# Patient Record
Sex: Female | Born: 1975 | Race: White | Hispanic: Yes | Marital: Single | Smoking: Never smoker
Health system: Southern US, Community
[De-identification: ages and names within clinical notes are randomized; demographics above are authoritative.]

## PROBLEM LIST (undated history)

## (undated) DIAGNOSIS — O139 Gestational [pregnancy-induced] hypertension without significant proteinuria, unspecified trimester: Secondary | ICD-10-CM

## (undated) DIAGNOSIS — G43909 Migraine, unspecified, not intractable, without status migrainosus: Secondary | ICD-10-CM

## (undated) HISTORY — DX: Gestational (pregnancy-induced) hypertension without significant proteinuria, unspecified trimester: O13.9

## (undated) HISTORY — DX: Migraine, unspecified, not intractable, without status migrainosus: G43.909

---

## 2003-06-28 ENCOUNTER — Inpatient Hospital Stay (HOSPITAL_COMMUNITY): Admission: AD | Admit: 2003-06-28 | Discharge: 2003-06-28 | Payer: Self-pay | Admitting: Obstetrics and Gynecology

## 2003-06-30 ENCOUNTER — Inpatient Hospital Stay (HOSPITAL_COMMUNITY): Admission: AD | Admit: 2003-06-30 | Discharge: 2003-06-30 | Payer: Self-pay | Admitting: Obstetrics & Gynecology

## 2004-01-03 ENCOUNTER — Ambulatory Visit (HOSPITAL_COMMUNITY): Admission: RE | Admit: 2004-01-03 | Discharge: 2004-01-03 | Payer: Self-pay | Admitting: Obstetrics

## 2004-07-29 ENCOUNTER — Inpatient Hospital Stay (HOSPITAL_COMMUNITY): Admission: AD | Admit: 2004-07-29 | Discharge: 2004-08-01 | Payer: Self-pay | Admitting: Obstetrics

## 2005-12-31 ENCOUNTER — Emergency Department (HOSPITAL_COMMUNITY): Admission: EM | Admit: 2005-12-31 | Discharge: 2005-12-31 | Payer: Self-pay | Admitting: Emergency Medicine

## 2006-07-06 ENCOUNTER — Emergency Department (HOSPITAL_COMMUNITY): Admission: EM | Admit: 2006-07-06 | Discharge: 2006-07-06 | Payer: Self-pay | Admitting: Emergency Medicine

## 2008-02-14 ENCOUNTER — Inpatient Hospital Stay (HOSPITAL_COMMUNITY): Admission: AD | Admit: 2008-02-14 | Discharge: 2008-02-14 | Payer: Self-pay | Admitting: Family Medicine

## 2008-06-09 ENCOUNTER — Ambulatory Visit (HOSPITAL_COMMUNITY): Admission: RE | Admit: 2008-06-09 | Discharge: 2008-06-09 | Payer: Self-pay | Admitting: Obstetrics & Gynecology

## 2008-08-05 ENCOUNTER — Ambulatory Visit: Payer: Self-pay | Admitting: Obstetrics and Gynecology

## 2008-08-05 ENCOUNTER — Inpatient Hospital Stay (HOSPITAL_COMMUNITY): Admission: AD | Admit: 2008-08-05 | Discharge: 2008-08-05 | Payer: Self-pay | Admitting: Obstetrics and Gynecology

## 2008-08-11 ENCOUNTER — Inpatient Hospital Stay (HOSPITAL_COMMUNITY): Admission: AD | Admit: 2008-08-11 | Discharge: 2008-08-11 | Payer: Self-pay | Admitting: Obstetrics & Gynecology

## 2008-08-11 ENCOUNTER — Ambulatory Visit: Payer: Self-pay | Admitting: Obstetrics & Gynecology

## 2008-08-14 ENCOUNTER — Ambulatory Visit: Payer: Self-pay | Admitting: Obstetrics and Gynecology

## 2008-08-14 ENCOUNTER — Inpatient Hospital Stay (HOSPITAL_COMMUNITY): Admission: AD | Admit: 2008-08-14 | Discharge: 2008-08-14 | Payer: Self-pay | Admitting: Obstetrics & Gynecology

## 2008-08-17 ENCOUNTER — Ambulatory Visit: Payer: Self-pay | Admitting: Obstetrics & Gynecology

## 2008-08-21 ENCOUNTER — Inpatient Hospital Stay (HOSPITAL_COMMUNITY): Admission: AD | Admit: 2008-08-21 | Discharge: 2008-08-25 | Payer: Self-pay | Admitting: Family Medicine

## 2008-08-21 ENCOUNTER — Ambulatory Visit: Payer: Self-pay | Admitting: Advanced Practice Midwife

## 2008-08-21 ENCOUNTER — Ambulatory Visit: Payer: Self-pay | Admitting: Family Medicine

## 2009-06-06 ENCOUNTER — Emergency Department (HOSPITAL_COMMUNITY): Admission: EM | Admit: 2009-06-06 | Discharge: 2009-06-06 | Payer: Self-pay | Admitting: Family Medicine

## 2011-01-14 NOTE — Discharge Summary (Signed)
NAME:  Martha Paul, Martha Paul NO.:  [id number]   MEDICAL RECORD NO.:  [id number]          PATIENT TYPE:  INP   LOCATION:                                [facility]   PHYSICIAN:  Tilda Burrow, M.D. DATE OF BIRTH:  [birth year]   DATE OF ADMISSION:  08/21/2008  DATE OF DISCHARGE:  08/25/2008                               DISCHARGE SUMMARY   ADMITTING DIAGNOSES:  Pregnancy 37 weeks' gestation, and pregnancy-  induced hypertension.   DISCHARGE DIAGNOSES:  Pregnancy-induced hypertension, delivered,    Escherichia coli urinary tract infection.   PROCEDURE:  1. A 24-hour urine collection completed.  2. Foley bulb insertion for labor induction, M. Cohen MD, August 23, 2008, spontaneous vertex vaginal delivery, female infant.  Apgars 8      and 9.  3. Magnesium sulfate, seizure prophylaxis, intrapartum and x24 hours      postpartum.   DISCHARGE MEDICATIONS:  1. Motrin 600 mg p.o. q.6 h. p.r.n. cramps.  2. Depo-Provera 150 mg IM x1 contraception.  3. Iron sulfate 325 mg b.i.d. x30 days.  4. Keflex 500 p.o. q.i.d. x5 days.   FOLLOWUP:  [facility] visit next week for blood pressure check.   HOSPITAL SUMMARY:  This 35 year old multiple gravida 6, para 3-0-1-3  presented at 37 weeks.  She had a prior history of vaginal delivery x2  followed by C-section for breech.  She presented to [facility] and  was found on the 26 to have elevated blood pressures.  She was admitted  for completion of 24-hour urine the afternoon on August 21, 2008, when  she had found blood pressure of 154/95 with 1+ proteinuria on office  visit.  A 24-hour urine was completed, showing 612 mg per day of  proteinuria.  The patient was therefore transferred to Labor and  Delivery on the 722 at 7:30 in the morning with Foley bulb inserted when  cervix was 1 cm in long.  Magnesium sulfate was initiated.  Penicillin  was initiated for antibiotic prophylaxis due to positive GBS status on  August 10, 2008.  She received penicillin prophylaxis through Foley  bulb ripening and Pitocin induction of labor.  Blood pressures were  acceptable during labor without significant medical management other  than the magnesium sulfate they remained in the 150/90 through labor.  The spontaneous delivery was at 01:59 on August 23, 2008, without  complications, 6 pounds 2 ounces female.  The patient received 24-hour  magnesium prophylaxis postpartum, and was  stable for discharge on the morning of August 24, 2008, with 1+  reflexes, 2+ edema.  No headaches, scotoma, or right upper quadrant  pain.  Followup will be in 1 week at home by [facility] nurse and 4 weeks  at routine postpartum visit at [facility].  She received Depo-  Provera prior to discharge.      Tilda Burrow, M.D.  Electronically Signed     JVF/MEDQ  D:  08/24/2008  T:  08/24/2008  Job:  [id number]   cc:   Health Department [facility]  Fax: [phone]   [facility]  Health Department

## 2011-01-17 NOTE — Op Note (Signed)
NAME:  Martha Paul, Martha Paul        ACCOUNT NO.:  [id number]   MEDICAL RECORD NO.:  [id number]          PATIENT TYPE:  INP   LOCATION:  9199                          [facility]ILITY:  [facility]   PHYSICIAN:  Kathreen Cosier, M.D.DATE OF BIRTH:  09-Oct-1975   DATE OF PROCEDURE:  07/29/2004  DATE OF DISCHARGE:                                 OPERATIVE REPORT   PREOPERATIVE DIAGNOSES:  1.  Breech presentation at term.  2.  Preeclampsia.   SURGEON:  Dr. Gaynell Face   ANESTHESIA:  Spinal.   DESCRIPTION OF PROCEDURE:  The patient placed on the operating table in a  supine position.  After the spinal administered, abdomen prepped and draped,  bladder emptied with Foley catheter.  Transverse suprapubic incision made,  carried down to the rectus fascia.  Fascia cleanly incised in line with the  incision.  Recti muscles retracted laterally.  Peritoneum incised  longitudinally.  Transverse incision made in the visceral peritoneum above  the bladder, bladder mobilized inferiorly.  Transverse lower uterine  incision made.  The patient delivered of a frank breech female, Apgars 8 and  9, weighing 6 pounds 2 ounces.  The fluid was clear, and the team was in  attendance.  Placenta was posterior, removed manually.  Uterine cavity  cleaned with dry laps.  Uterine incision closed in one layer with continuous  suture of #1 chromic.  Hemostasis was satisfactory.  Bladder flap reattached  with 2-0 chromic.  Uterus well contracted.  Tubes and ovaries normal.  Abdomen closed in layers, peritoneum with continuous suture of 0 chromic,  fascia continuous suture of 0 Dexon, skin closed with subcuticular stitch of  4-0 Monocryl.  Blood loss 400 mL.      BAM/MEDQ  D:  07/29/2004  T:  07/29/2004  Job:  [id number]

## 2011-01-17 NOTE — Discharge Summary (Signed)
NAME:  Martha Paul, Martha Paul        ACCOUNT NO.:  [id number]   MEDICAL RECORD NO.:  [id number]          PATIENT TYPE:  INP   LOCATION:  9145                          [facility]ILITY[facility]  [facility]   PHYSICIAN:  Kathreen Cosier, M.D.DATE OF BIRTH:  Aug 25, 1976   DATE OF ADMISSION:  07/29/2004  DATE OF DISCHARGE:  08/01/2004                                 DISCHARGE SUMMARY   HOSPITAL COURSE:  The patient is a 35 year old gravida 3, para 2, 0-0-2 with  [facility] August 05, 2004.  Negative GBS.  She came in because her blood pressure  was 160/94 with plus protein.  Ultrasound revealed a breech presentation.  She was started on  magnesium sulfate, 4 g loading and 2 g per hour.  The  patient underwent a low transverse cesarean section because of a breech  presentation at term and preeclamptic.  She had a female 6 pounds 2 ounces  with Apgars of 8 and 9.  Postoperatively, her output remained good.  Blood  pressure was 120/86, hemoglobin 9, uric acid 5.3.  Magnesium sulfate 4.1.  SGOT 18.  Sodium 134, potassium 4.9.  Magnesium sulfated was continued  through postop day #1.  By postop day #2, blood pressure was 130/70, uric  acid 5.7, magnesium sulfate 4.3.  She had 3+ edema.  She was stated on  hydrochlorothiazide 50 mg p.o. daily.  By day #3, blood pressures were  120/70 and hemoglobin 8.8.  Her output remained good and she was discharged  home on hydrochlorothiazide 50 mg p.o. daily.  Instructions were given in  Spanish and she should be followed at home with blood pressure readings by  the home visiting nurse.   DISCHARGE DIAGNOSIS:  Status post primary low transverse cesarean section at  term for a breech presentation in a preeclamptic patient.      BAM/MEDQ  D:  08/28/2004  T:  08/28/2004  Job:  [id number]

## 2011-01-17 NOTE — H&P (Signed)
NAME:  Martha Paul, Martha Paul        ACCOUNT NO.:  [id number]   MEDICAL RECORD NO.:  [id number]          PATIENT TYPE:  INP   LOCATION:  9199                          [facility]  [facility]   PHYSICIAN:  Kathreen Cosier, M.D.DATE OF BIRTH:  [birth year]   DATE OF ADMISSION:  07/29/2004  DATE OF DISCHARGE:                                HISTORY & PHYSICAL   HISTORY OF PRESENT ILLNESS:  The patient is a 35 year old gravida 3, para 2-  0-0-2, [facility] December 5, negative GBS.  She is in the office today with blood  pressure of 160/94 plus proteinuria.  The presenting part could not be  determined.  She was admitted because of preeclampsia.  Her uric acid was  4.7, LDH 169, sodium 136, potassium 4, chloride 108, glucose 77, BUN 5,  alkaline phosphatase 132.  SGOT 16, SGPT 13.  Ultrasound revealed frank  breech presentation.  It was decided the patient would be delivered by C-  section because of breech presentation at term and preeclampsia.   PHYSICAL EXAMINATION:  GENERAL:  Revealed a well-developed female in no  distress.  HEENT:  Negative.  LUNGS:  Clear.  HEART:  Regular rhythm, no murmurs, no gallops.  ABDOMEN:  Term.  EXTREMITIES:  4+ pedal edema.      BAM/MEDQ  D:  07/29/2004  T:  07/29/2004  Job:  [id number]

## 2011-05-29 LAB — GC/CHLAMYDIA PROBE AMP, GENITAL
Chlamydia, DNA Probe: NEGATIVE
GC Probe Amp, Genital: NEGATIVE

## 2011-05-29 LAB — CBC
HCT: 35.8 — ABNORMAL LOW
Hemoglobin: 12.6
MCHC: 35.3
MCV: 92
Platelets: 194
RBC: 3.89
RDW: 13.4
WBC: 7.6

## 2011-05-29 LAB — POCT PREGNANCY, URINE
Operator id: 202651
Preg Test, Ur: POSITIVE

## 2011-05-29 LAB — WET PREP, GENITAL
Trich, Wet Prep: NONE SEEN
Yeast Wet Prep HPF POC: NONE SEEN

## 2011-06-06 LAB — COMPREHENSIVE METABOLIC PANEL
ALT: 11 U/L (ref 0–35)
ALT: 13 U/L (ref 0–35)
ALT: 13 U/L (ref 0–35)
ALT: 31 U/L (ref 0–35)
AST: 14 U/L (ref 0–37)
AST: 16 U/L (ref 0–37)
AST: 51 U/L — ABNORMAL HIGH (ref 0–37)
Albumin: 2.5 g/dL — ABNORMAL LOW (ref 3.5–5.2)
Albumin: 2.6 g/dL — ABNORMAL LOW (ref 3.5–5.2)
Albumin: 2.8 g/dL — ABNORMAL LOW (ref 3.5–5.2)
Alkaline Phosphatase: 109 U/L (ref 39–117)
Alkaline Phosphatase: 82 U/L (ref 39–117)
Alkaline Phosphatase: 89 U/L (ref 39–117)
BUN: 2 mg/dL — ABNORMAL LOW (ref 6–23)
BUN: 3 mg/dL — ABNORMAL LOW (ref 6–23)
BUN: 4 mg/dL — ABNORMAL LOW (ref 6–23)
CO2: 25 mEq/L (ref 19–32)
CO2: 25 mEq/L (ref 19–32)
CO2: 25 mEq/L (ref 19–32)
Calcium: 8.7 mg/dL (ref 8.4–10.5)
Calcium: 8.8 mg/dL (ref 8.4–10.5)
Calcium: 9.1 mg/dL (ref 8.4–10.5)
Chloride: 104 mEq/L (ref 96–112)
Chloride: 105 mEq/L (ref 96–112)
Chloride: 105 mEq/L (ref 96–112)
Chloride: 106 mEq/L (ref 96–112)
Creatinine, Ser: 0.35 mg/dL — ABNORMAL LOW (ref 0.4–1.2)
Creatinine, Ser: 0.35 mg/dL — ABNORMAL LOW (ref 0.4–1.2)
GFR calc Af Amer: >60 mL/min (ref >60)
GFR calc Af Amer: >60 mL/min (ref >60)
GFR calc Af Amer: >60 mL/min (ref >60)
GFR calc non Af Amer: >60 mL/min (ref >60)
GFR calc non Af Amer: >60 mL/min (ref >60)
GFR calc non Af Amer: >60 mL/min (ref >60)
Glucose, Bld: 100 mg/dL — ABNORMAL HIGH (ref 70–99)
Glucose, Bld: 138 mg/dL — ABNORMAL HIGH (ref 70–99)
Glucose, Bld: 81 mg/dL (ref 70–99)
Glucose, Bld: 90 mg/dL (ref 70–99)
Potassium: 3.4 mEq/L — ABNORMAL LOW (ref 3.5–5.1)
Potassium: 3.6 mEq/L (ref 3.5–5.1)
Potassium: 3.8 mEq/L (ref 3.5–5.1)
Potassium: 4.1 mEq/L (ref 3.5–5.1)
Sodium: 137 mEq/L (ref 135–145)
Sodium: 137 mEq/L (ref 135–145)
Sodium: 137 mEq/L (ref 135–145)
Sodium: 137 mEq/L (ref 135–145)
Total Bilirubin: 0.3 mg/dL (ref 0.3–1.2)
Total Bilirubin: 0.3 mg/dL (ref 0.3–1.2)
Total Bilirubin: 0.4 mg/dL (ref 0.3–1.2)
Total Bilirubin: 0.5 mg/dL (ref 0.3–1.2)
Total Protein: 5.3 g/dL — ABNORMAL LOW (ref 6.0–8.3)
Total Protein: 5.3 g/dL — ABNORMAL LOW (ref 6.0–8.3)
Total Protein: 5.8 g/dL — ABNORMAL LOW (ref 6.0–8.3)

## 2011-06-06 LAB — CBC
HCT: 32.6 % — ABNORMAL LOW (ref 36.0–46.0)
HCT: 35.1 % — ABNORMAL LOW (ref 36.0–46.0)
HCT: 35.3 % — ABNORMAL LOW (ref 36.0–46.0)
HCT: 36.3 % (ref 36.0–46.0)
Hemoglobin: 11.2 g/dL — ABNORMAL LOW (ref 12.0–15.0)
Hemoglobin: 11.9 g/dL — ABNORMAL LOW (ref 12.0–15.0)
Hemoglobin: 12 g/dL (ref 12.0–15.0)
Hemoglobin: 12.1 g/dL (ref 12.0–15.0)
MCHC: 33.4 g/dL (ref 30.0–36.0)
MCHC: 33.8 g/dL (ref 30.0–36.0)
MCHC: 34.3 g/dL (ref 30.0–36.0)
MCV: 89.9 fL (ref 78.0–100.0)
MCV: 90.2 fL (ref 78.0–100.0)
MCV: 90.6 fL (ref 78.0–100.0)
MCV: 91.3 fL (ref 78.0–100.0)
Platelets: 208 10*3/uL (ref 150–400)
Platelets: 228 10*3/uL (ref 150–400)
Platelets: 241 10*3/uL (ref 150–400)
Platelets: 245 10*3/uL (ref 150–400)
RBC: 3.6 MIL/uL — ABNORMAL LOW (ref 3.87–5.11)
RBC: 3.87 MIL/uL (ref 3.87–5.11)
RBC: 3.87 MIL/uL (ref 3.87–5.11)
RBC: 4.04 MIL/uL (ref 3.87–5.11)
RBC: 4.35 MIL/uL (ref 3.87–5.11)
RDW: 12.6 % (ref 11.5–15.5)
RDW: 12.8 % (ref 11.5–15.5)
RDW: 12.9 % (ref 11.5–15.5)
RDW: 12.9 % (ref 11.5–15.5)
RDW: 13.4 % (ref 11.5–15.5)
WBC: 10.5 10*3/uL (ref 4.0–10.5)
WBC: 12 10*3/uL — ABNORMAL HIGH (ref 4.0–10.5)
WBC: 15.9 10*3/uL — ABNORMAL HIGH (ref 4.0–10.5)
WBC: 9.1 10*3/uL (ref 4.0–10.5)
WBC: 9.5 10*3/uL (ref 4.0–10.5)

## 2011-06-06 LAB — CREATININE CLEARANCE, URINE, 24 HOUR
Collection Interval-CRCL: 24 hours
Creatinine Clearance: 232 mL/min — ABNORMAL HIGH (ref 75–115)
Creatinine, 24H Ur: 1167 mg/d (ref 700–1800)
Creatinine, Urine: 88.4 mg/dL
Creatinine: 0.35 mg/dL — ABNORMAL LOW (ref 0.40–1.20)
Creatinine: 0.42 mg/dL (ref 0.40–1.20)
Urine Total Volume-CRCL: 1320 mL
Urine Total Volume-CRCL: 2550 mL

## 2011-06-06 LAB — PROTEIN, URINE, 24 HOUR
Collection Interval-UPROT: 24 hours
Collection Interval-UPROT: 24 hours
Protein, 24H Urine: 145 mg/d — ABNORMAL HIGH (ref 50–100)
Protein, 24H Urine: 612 mg/d — ABNORMAL HIGH (ref 50–100)
Protein, Urine: 11 mg/dL
Protein, Urine: 24 mg/dL
Urine Total Volume-UPROT: 1320 mL
Urine Total Volume-UPROT: 2550 mL

## 2011-06-06 LAB — RPR: RPR Ser Ql: NONREACTIVE

## 2011-06-06 LAB — POCT URINALYSIS DIP (DEVICE)
Bilirubin Urine: NEGATIVE
Glucose, UA: NEGATIVE mg/dL
Ketones, ur: NEGATIVE mg/dL
Nitrite: POSITIVE — AB
Protein, ur: 100 mg/dL — AB
Specific Gravity, Urine: 1.02 (ref 1.005–1.030)
Urobilinogen, UA: 0.2 mg/dL (ref 0.0–1.0)
pH: 7 (ref 5.0–8.0)

## 2011-06-06 LAB — URINALYSIS, ROUTINE W REFLEX MICROSCOPIC
Bilirubin Urine: NEGATIVE
Glucose, UA: NEGATIVE mg/dL
Glucose, UA: NEGATIVE mg/dL
Hgb urine dipstick: NEGATIVE
Hgb urine dipstick: NEGATIVE
Ketones, ur: NEGATIVE mg/dL
Leukocytes, UA: NEGATIVE
Nitrite: NEGATIVE
Protein, ur: NEGATIVE mg/dL
Specific Gravity, Urine: 1.015 (ref 1.005–1.030)
Specific Gravity, Urine: <1.005 — ABNORMAL LOW (ref 1.005–1.030)
Urobilinogen, UA: 0.2 mg/dL (ref 0.0–1.0)
pH: 7 (ref 5.0–8.0)
pH: 7.5 (ref 5.0–8.0)

## 2011-06-06 LAB — URINALYSIS, DIPSTICK ONLY
Glucose, UA: NEGATIVE mg/dL
Ketones, ur: NEGATIVE mg/dL
Nitrite: POSITIVE — AB
Protein, ur: 30 mg/dL — AB
Specific Gravity, Urine: 1.025 (ref 1.005–1.030)
Urobilinogen, UA: 0.2 mg/dL (ref 0.0–1.0)
pH: 6.5 (ref 5.0–8.0)

## 2011-06-06 LAB — URIC ACID
Uric Acid, Serum: 3.6 mg/dL (ref 2.4–7.0)
Uric Acid, Serum: 3.8 mg/dL (ref 2.4–7.0)

## 2011-06-06 LAB — URINE MICROSCOPIC-ADD ON

## 2011-06-06 LAB — LACTATE DEHYDROGENASE: LDH: 143 U/L (ref 94–250)

## 2014-04-10 ENCOUNTER — Other Ambulatory Visit (HOSPITAL_COMMUNITY): Payer: Self-pay | Admitting: Physician Assistant

## 2014-04-10 ENCOUNTER — Other Ambulatory Visit: Payer: Self-pay

## 2014-04-10 DIAGNOSIS — Z3689 Encounter for other specified antenatal screening: Secondary | ICD-10-CM

## 2014-04-10 LAB — OB RESULTS CONSOLE ABO/RH: RH Type: POSITIVE

## 2014-04-10 LAB — OB RESULTS CONSOLE GC/CHLAMYDIA
CHLAMYDIA, DNA PROBE: NEGATIVE
GC PROBE AMP, GENITAL: NEGATIVE

## 2014-04-10 LAB — OB RESULTS CONSOLE RUBELLA ANTIBODY, IGM: RUBELLA: IMMUNE

## 2014-04-10 LAB — OB RESULTS CONSOLE ANTIBODY SCREEN: Antibody Screen: NEGATIVE

## 2014-04-10 LAB — OB RESULTS CONSOLE HEPATITIS B SURFACE ANTIGEN: Hepatitis B Surface Ag: NEGATIVE

## 2014-04-10 LAB — OB RESULTS CONSOLE HIV ANTIBODY (ROUTINE TESTING): HIV: NONREACTIVE

## 2014-04-10 LAB — OB RESULTS CONSOLE RPR: RPR: NONREACTIVE

## 2014-04-11 ENCOUNTER — Ambulatory Visit (HOSPITAL_COMMUNITY)
Admission: RE | Admit: 2014-04-11 | Discharge: 2014-04-11 | Disposition: A | Discharge status: Home / Self Care | Payer: Medicaid Other | Source: Ambulatory Visit | Attending: Physician Assistant | Admitting: Physician Assistant

## 2014-04-11 ENCOUNTER — Other Ambulatory Visit: Payer: Self-pay

## 2014-04-11 ENCOUNTER — Other Ambulatory Visit (HOSPITAL_COMMUNITY): Payer: Self-pay | Admitting: Physician Assistant

## 2014-04-11 DIAGNOSIS — Z3689 Encounter for other specified antenatal screening: Secondary | ICD-10-CM

## 2014-04-18 ENCOUNTER — Other Ambulatory Visit (HOSPITAL_COMMUNITY): Payer: Self-pay | Admitting: Physician Assistant

## 2014-04-18 DIAGNOSIS — O280 Abnormal hematological finding on antenatal screening of mother: Secondary | ICD-10-CM

## 2014-04-19 ENCOUNTER — Encounter (HOSPITAL_COMMUNITY): Payer: Medicaid Other

## 2014-04-19 ENCOUNTER — Ambulatory Visit (HOSPITAL_COMMUNITY): Admission: RE | Admit: 2014-04-19 | Payer: Medicaid Other | Source: Ambulatory Visit

## 2014-04-19 ENCOUNTER — Other Ambulatory Visit (HOSPITAL_COMMUNITY): Payer: Self-pay | Admitting: Physician Assistant

## 2014-04-19 DIAGNOSIS — IMO0002 Reserved for concepts with insufficient information to code with codable children: Secondary | ICD-10-CM

## 2014-04-19 DIAGNOSIS — Z0489 Encounter for examination and observation for other specified reasons: Secondary | ICD-10-CM

## 2014-04-20 ENCOUNTER — Encounter (HOSPITAL_COMMUNITY): Payer: Medicaid Other

## 2014-04-20 ENCOUNTER — Ambulatory Visit (HOSPITAL_COMMUNITY): Payer: Medicaid Other

## 2014-04-26 ENCOUNTER — Ambulatory Visit (HOSPITAL_COMMUNITY)
Admission: RE | Admit: 2014-04-26 | Discharge: 2014-04-26 | Disposition: A | Discharge status: Home / Self Care | Payer: Medicaid Other | Source: Ambulatory Visit | Attending: Physician Assistant | Admitting: Physician Assistant

## 2014-04-26 ENCOUNTER — Ambulatory Visit (HOSPITAL_COMMUNITY): Payer: Medicaid Other

## 2014-04-26 DIAGNOSIS — O09522 Supervision of elderly multigravida, second trimester: Secondary | ICD-10-CM

## 2014-04-26 NOTE — Progress Notes (Signed)
°  DOB: 08-19-1976 Referring Provider: Quentin Mulling, PA-C Appointment Date: 04/26/2014 Attending: Dr. Particia Nearing  Martha Paul was seen for genetic counseling regarding a maternal age of 38 y.o. and an increased risk for Down syndrome based on an abnormal Quad screen.  Spanish interpreter was present and provided translation services for the patient.  She was counseled regarding maternal age and the association with risk for chromosome conditions due to nondisjunction with aging of the ova.   We reviewed chromosomes, nondisjunction, and the associated 1 in 22 risk for fetal aneuploidy related to a maternal age of 38 y.o..  She was counseled that the risk for aneuploidy decreases as gestational age increases, accounting for those pregnancies which spontaneously abort.  We specifically discussed Down syndrome (trisomy 61), trisomies 64 and 84, and sex chromosome aneuploidies (47,XXX and 47,XXY) including the common features and prognoses of each.   We also reviewed Ms. Lopezs maternal serum Quad screen result and the associated increase in risk for fetal Down syndrome (1 in 225 to 1 in 107).  She was counseled regarding other explanations for a screen positive result including normal variation and differences in maternal metabolism.  In addition, we reviewed the screen adjusted reduction in risks for trisomy 18 and ONTDs.  She understands that Quad screening provides a pregnancy specific risk for Down syndrome, but is not considered to be diagnostic.    We reviewed other available screening options including noninvasive prenatal testing (NIPT) and detailed ultrasound.  Specifically, we discussed that NIPT analyzes cell free fetal DNA found in the maternal circulation. This test is not diagnostic for chromosome conditions, but can provide information regarding the presence or absence of extra fetal DNA for chromosomes 13, 18, 21, X, and Y, and missing fetal DNA for chromosome X and Y (Turner  syndrome). Thus, it would not identify or rule out all genetic conditions.  In addition, we discussed that ~50-80% of fetuses with Down syndrome and up to 90-95% of fetuses with trisomy 18/13, when well visualized, have detectable anomalies or soft markers by detailed ultrasound (~18+ weeks gestation).  Given that her ultrasound at 18 weeks did not identify any fetal anomalies or soft markers of aneuploidy, the chance for these conditions can be reduced.  She was also counseled regarding diagnostic testing via amniocentesis.  We reviewed the approximate 1 in 300-500 risk for complications, including spontaneous pregnancy loss. After consideration of all the options, she declined additional testing and screening today.  Ms. Shawnie Dapper was provided with written information regarding sickle cell anemia (SCA) including the carrier frequency and incidence in the Hispanic population, the availability of carrier testing and prenatal diagnosis if indicated.  In addition, we discussed that hemoglobinopathies are routinely screened for as part of the [facility] newborn screening panel.  She was counseled that this testing was already performed through her referring physicians office and was found to be normal.  Ms. Shawnie Dapper denied exposure to environmental toxins or chemical agents. She denied the use of alcohol, tobacco or street drugs. She denied significant viral illnesses during the course of her pregnancy. Her medical and surgical histories were noncontributory.     I counseled Ms. Shawnie Dapper regarding the above risks and available options.  The approximate face-to-face time with the genetic counselor was 45 minutes.    Mady Gemma, MS,  Certified Genetic Counselor

## 2014-05-09 ENCOUNTER — Ambulatory Visit (HOSPITAL_COMMUNITY)
Admission: RE | Admit: 2014-05-09 | Discharge: 2014-05-09 | Disposition: A | Discharge status: Home / Self Care | Payer: Medicaid Other | Source: Ambulatory Visit | Attending: Physician Assistant | Admitting: Physician Assistant

## 2014-05-09 DIAGNOSIS — Z0489 Encounter for examination and observation for other specified reasons: Secondary | ICD-10-CM

## 2014-05-09 DIAGNOSIS — IMO0002 Reserved for concepts with insufficient information to code with codable children: Secondary | ICD-10-CM

## 2014-05-29 ENCOUNTER — Encounter: Payer: Self-pay | Admitting: Obstetrics and Gynecology

## 2014-05-29 ENCOUNTER — Ambulatory Visit (INDEPENDENT_AMBULATORY_CARE_PROVIDER_SITE_OTHER): Payer: Medicaid Other | Admitting: Obstetrics and Gynecology

## 2014-05-29 VITALS — BP 155/108 | HR 84 | Temp 98.0°F | Ht 59.0 in | Wt 179.2 lb

## 2014-05-29 DIAGNOSIS — O139 Gestational [pregnancy-induced] hypertension without significant proteinuria, unspecified trimester: Secondary | ICD-10-CM | POA: Diagnosis not present

## 2014-05-29 DIAGNOSIS — O09899 Supervision of other high risk pregnancies, unspecified trimester: Secondary | ICD-10-CM | POA: Insufficient documentation

## 2014-05-29 DIAGNOSIS — O09299 Supervision of pregnancy with other poor reproductive or obstetric history, unspecified trimester: Secondary | ICD-10-CM

## 2014-05-29 DIAGNOSIS — Z23 Encounter for immunization: Secondary | ICD-10-CM

## 2014-05-29 DIAGNOSIS — O09529 Supervision of elderly multigravida, unspecified trimester: Secondary | ICD-10-CM | POA: Diagnosis not present

## 2014-05-29 DIAGNOSIS — O0990 Supervision of high risk pregnancy, unspecified, unspecified trimester: Secondary | ICD-10-CM | POA: Diagnosis not present

## 2014-05-29 DIAGNOSIS — O34219 Maternal care for unspecified type scar from previous cesarean delivery: Secondary | ICD-10-CM

## 2014-05-29 DIAGNOSIS — G43009 Migraine without aura, not intractable, without status migrainosus: Secondary | ICD-10-CM | POA: Diagnosis not present

## 2014-05-29 DIAGNOSIS — O09522 Supervision of elderly multigravida, second trimester: Secondary | ICD-10-CM

## 2014-05-29 DIAGNOSIS — O0992 Supervision of high risk pregnancy, unspecified, second trimester: Secondary | ICD-10-CM

## 2014-05-29 DIAGNOSIS — O09892 Supervision of other high risk pregnancies, second trimester: Secondary | ICD-10-CM

## 2014-05-29 DIAGNOSIS — O165 Unspecified maternal hypertension, complicating the puerperium: Secondary | ICD-10-CM | POA: Insufficient documentation

## 2014-05-29 LAB — POCT URINALYSIS DIP (DEVICE)
BILIRUBIN URINE: NEGATIVE
Glucose, UA: NEGATIVE mg/dL
KETONES UR: NEGATIVE mg/dL
Leukocytes, UA: NEGATIVE
Nitrite: NEGATIVE
SPECIFIC GRAVITY, URINE: 1.025 (ref 1.005–1.030)
Urobilinogen, UA: 0.2 mg/dL (ref 0.0–1.0)
pH: 6.5 (ref 5.0–8.0)

## 2014-05-29 MED ORDER — ASPIRIN 81 MG PO TABS: 81.0000 mg | ORAL_TABLET | Freq: Every day | ORAL | Status: DC

## 2014-05-29 MED ORDER — LABETALOL HCL 200 MG PO TABS: 200.0000 mg | ORAL_TABLET | Freq: Two times a day (BID) | ORAL | Status: DC

## 2014-05-29 NOTE — Progress Notes (Signed)
Nutrition note: 1st visit consult Pt has gained 23.2# @ 25w, which is > expected. Pt reports eating 3 meals/d. Pt is taking a PNV but reports it makes her nauseous. Pt reports having heartburn occ. Pt reports walking occ but it makes her tired quickly. Pt received verbal & written education on general nutrition during pregnancy. Suggested 2 chewable multivitamins vs PNV to help decrease nausea. Discussed options for BF when pt goes back to work ~6 months. Discussed wt gain goals of 11-20# or 0.5#/wk. Pt agrees to monitor portion sizes. Pt has WIC & plans to BF (initially stated she plans to stop ~21m due to returning to work but after discussing options, pt agrees to try to BF when not at work for as long as she can). F/u as needed Blondell Reveal, MS, RD, LDN, [facility]

## 2014-05-29 NOTE — Progress Notes (Signed)
Edema- "feet and face sometimes" Pt states that she is not take any BP medication Consented for flu vaccine and info given  New ob packet given

## 2014-05-29 NOTE — Progress Notes (Signed)
Patient transferred care from HD secondary to [facility]. Patient with h/o PIH involving iol at 37 weeks. Patient is currently not taking any BP medications. She denies any HA, visual disturbances, RUQ/epigastric pain. Will start labetalol 200 mg BID and ASA 81 mg daily. Will collect 24 hr urine protein. Patient with migraine headaches prior to pregnancy- no issues thus far. Advised patient to monitor intensity and frequency of HA- ok to take tylenol prn Patient with previous c-section with successful VBAC- desires repeat c-section with BTL

## 2014-05-31 ENCOUNTER — Encounter: Payer: Self-pay | Admitting: *Deleted

## 2014-06-02 LAB — COMPREHENSIVE METABOLIC PANEL
ALBUMIN: 2.9 g/dL — AB (ref 3.5–5.2)
ALT: 12 U/L (ref 0–35)
AST: 14 U/L (ref 0–37)
Alkaline Phosphatase: 84 U/L (ref 39–117)
BUN: 9 mg/dL (ref 6–23)
CALCIUM: 8.6 mg/dL (ref 8.4–10.5)
CHLORIDE: 106 meq/L (ref 96–112)
CO2: 21 meq/L (ref 19–32)
CREATININE: 0.44 mg/dL — AB (ref 0.50–1.10)
GLUCOSE: 89 mg/dL (ref 70–99)
Potassium: 3.7 mEq/L (ref 3.5–5.3)
Sodium: 138 mEq/L (ref 135–145)
Total Bilirubin: 0.2 mg/dL (ref 0.2–1.2)
Total Protein: 5.3 g/dL — ABNORMAL LOW (ref 6.0–8.3)

## 2014-06-02 LAB — CBC
HEMATOCRIT: 38 % (ref 36.0–46.0)
HEMOGLOBIN: 13 g/dL (ref 12.0–15.0)
MCH: 30.7 pg (ref 26.0–34.0)
MCHC: 34.2 g/dL (ref 30.0–36.0)
MCV: 89.6 fL (ref 78.0–100.0)
Platelets: 202 10*3/uL (ref 150–400)
RBC: 4.24 MIL/uL (ref 3.87–5.11)
RDW: 14 % (ref 11.5–15.5)
WBC: 9.2 10*3/uL (ref 4.0–10.5)

## 2014-06-03 LAB — PROTEIN, URINE, 24 HOUR
PROTEIN 24H UR: 3434 mg/d — AB (ref <150)
PROTEIN, URINE: 101 mg/dL — AB (ref 5–24)

## 2014-06-04 ENCOUNTER — Encounter: Payer: Self-pay | Admitting: Obstetrics and Gynecology

## 2014-06-05 ENCOUNTER — Inpatient Hospital Stay (HOSPITAL_COMMUNITY)
Admission: EM | Admit: 2014-06-05 | Discharge: 2014-06-09 | DRG: 765 | Disposition: A | Discharge status: Home / Self Care | Payer: Medicaid Other | Attending: Obstetrics & Gynecology | Admitting: Obstetrics & Gynecology

## 2014-06-05 ENCOUNTER — Emergency Department (HOSPITAL_COMMUNITY): Payer: Medicaid Other

## 2014-06-05 ENCOUNTER — Encounter (HOSPITAL_COMMUNITY): Payer: Self-pay | Admitting: Emergency Medicine

## 2014-06-05 DIAGNOSIS — O1493 Unspecified pre-eclampsia, third trimester: Secondary | ICD-10-CM

## 2014-06-05 DIAGNOSIS — O163 Unspecified maternal hypertension, third trimester: Secondary | ICD-10-CM

## 2014-06-05 DIAGNOSIS — O1492 Unspecified pre-eclampsia, second trimester: Secondary | ICD-10-CM

## 2014-06-05 DIAGNOSIS — O3421 Maternal care for scar from previous cesarean delivery: Secondary | ICD-10-CM | POA: Diagnosis present

## 2014-06-05 DIAGNOSIS — Z302 Encounter for sterilization: Secondary | ICD-10-CM

## 2014-06-05 DIAGNOSIS — O09522 Supervision of elderly multigravida, second trimester: Secondary | ICD-10-CM

## 2014-06-05 DIAGNOSIS — O365120 Maternal care for known or suspected placental insufficiency, second trimester, not applicable or unspecified: Secondary | ICD-10-CM | POA: Diagnosis present

## 2014-06-05 DIAGNOSIS — Z3A26 26 weeks gestation of pregnancy: Secondary | ICD-10-CM

## 2014-06-05 DIAGNOSIS — O0993 Supervision of high risk pregnancy, unspecified, third trimester: Secondary | ICD-10-CM

## 2014-06-05 DIAGNOSIS — O1490 Unspecified pre-eclampsia, unspecified trimester: Secondary | ICD-10-CM | POA: Diagnosis present

## 2014-06-05 DIAGNOSIS — G43909 Migraine, unspecified, not intractable, without status migrainosus: Secondary | ICD-10-CM | POA: Diagnosis present

## 2014-06-05 DIAGNOSIS — O4102X0 Oligohydramnios, second trimester, not applicable or unspecified: Secondary | ICD-10-CM | POA: Diagnosis present

## 2014-06-05 DIAGNOSIS — O365920 Maternal care for other known or suspected poor fetal growth, second trimester, not applicable or unspecified: Secondary | ICD-10-CM | POA: Diagnosis present

## 2014-06-05 DIAGNOSIS — Z79899 Other long term (current) drug therapy: Secondary | ICD-10-CM

## 2014-06-05 DIAGNOSIS — Z7982 Long term (current) use of aspirin: Secondary | ICD-10-CM

## 2014-06-05 DIAGNOSIS — N858 Other specified noninflammatory disorders of uterus: Secondary | ICD-10-CM | POA: Diagnosis present

## 2014-06-05 DIAGNOSIS — O0992 Supervision of high risk pregnancy, unspecified, second trimester: Secondary | ICD-10-CM

## 2014-06-05 DIAGNOSIS — O1422 HELLP syndrome (HELLP), second trimester: Principal | ICD-10-CM | POA: Diagnosis present

## 2014-06-05 LAB — CBC WITH DIFFERENTIAL/PLATELET
BASOS ABS: 0 10*3/uL (ref 0.0–0.1)
Basophils Relative: 0 % (ref 0–1)
Eosinophils Absolute: 0 10*3/uL (ref 0.0–0.7)
Eosinophils Relative: 0 % (ref 0–5)
HCT: 41 % (ref 36.0–46.0)
Hemoglobin: 14.3 g/dL (ref 12.0–15.0)
LYMPHS ABS: 2.9 10*3/uL (ref 0.7–4.0)
LYMPHS PCT: 23 % (ref 12–46)
MCH: 30.8 pg (ref 26.0–34.0)
MCHC: 34.9 g/dL (ref 30.0–36.0)
MCV: 88.4 fL (ref 78.0–100.0)
Monocytes Absolute: 0.7 10*3/uL (ref 0.1–1.0)
Monocytes Relative: 6 % (ref 3–12)
NEUTROS PCT: 71 % (ref 43–77)
Neutro Abs: 9 10*3/uL — ABNORMAL HIGH (ref 1.7–7.7)
PLATELETS: 196 10*3/uL (ref 150–400)
RBC: 4.64 MIL/uL (ref 3.87–5.11)
RDW: 12.9 % (ref 11.5–15.5)
WBC: 12.7 10*3/uL — AB (ref 4.0–10.5)

## 2014-06-05 LAB — COMPREHENSIVE METABOLIC PANEL
ALK PHOS: 96 U/L (ref 39–117)
ALT: 172 U/L — AB (ref 0–35)
AST: 157 U/L — AB (ref 0–37)
Albumin: 2.5 g/dL — ABNORMAL LOW (ref 3.5–5.2)
Anion gap: 13 (ref 5–15)
BUN: 14 mg/dL (ref 6–23)
CALCIUM: 9.1 mg/dL (ref 8.4–10.5)
CO2: 21 meq/L (ref 19–32)
Chloride: 100 mEq/L (ref 96–112)
Creatinine, Ser: 0.56 mg/dL (ref 0.50–1.10)
GLUCOSE: 117 mg/dL — AB (ref 70–99)
POTASSIUM: 3.6 meq/L — AB (ref 3.7–5.3)
SODIUM: 134 meq/L — AB (ref 137–147)
TOTAL PROTEIN: 6.4 g/dL (ref 6.0–8.3)
Total Bilirubin: <0.2 mg/dL — ABNORMAL LOW (ref 0.3–1.2)

## 2014-06-05 LAB — LIPASE, BLOOD: Lipase: 25 U/L (ref 11–59)

## 2014-06-05 MED ORDER — LABETALOL HCL 5 MG/ML IV SOLN
20.0000 mg | Freq: Once | INTRAVENOUS | Status: AC
  Administered 2014-06-05: 20 mg via INTRAVENOUS
  Filled 2014-06-05: qty 4

## 2014-06-05 MED ORDER — BETAMETHASONE SOD PHOS & ACET 6 (3-3) MG/ML IJ SUSP
12.0000 mg | Freq: Once | INTRAMUSCULAR | Status: DC
  Administered 2014-06-05: 12 mg via INTRAMUSCULAR
  Filled 2014-06-05: qty 2

## 2014-06-05 MED ORDER — ALUM & MAG HYDROXIDE-SIMETH 200-200-20 MG/5ML PO SUSP
30.0000 mL | Freq: Once | ORAL | Status: AC
  Administered 2014-06-05: 30 mL via ORAL
  Filled 2014-06-05: qty 30

## 2014-06-05 MED ORDER — LIDOCAINE VISCOUS 2 % MT SOLN
15.0000 mL | Freq: Once | OROMUCOSAL | Status: AC
  Administered 2014-06-05: 15 mL via OROMUCOSAL
  Filled 2014-06-05: qty 15

## 2014-06-05 MED ORDER — MAGNESIUM SULFATE 40 MG/ML IJ SOLN
4.0000 g | Freq: Once | INTRAMUSCULAR | Status: AC
  Administered 2014-06-05: 4 g via INTRAVENOUS
  Filled 2014-06-05: qty 100

## 2014-06-05 NOTE — ED Provider Notes (Signed)
CSN: [id number]     Arrival date & time 06/05/14  2102 History   First MD Initiated Contact with Patient 06/05/14 2112     Chief Complaint  Patient presents with  . Abdominal Pain  . 6 months pregnant     (Consider location/radiation/quality/duration/timing/severity/associated sxs/prior Treatment) Patient is a 38 y.o. female presenting with abdominal pain.  Abdominal Pain Pain location:  Epigastric Pain quality: sharp   Pain radiates to:  Does not radiate Pain severity:  Moderate Onset quality:  Sudden Duration:  1 hour Timing:  Constant Progression:  Unchanged Chronicity:  New Context comment:  [redacted] weeks pregnant by US @ 25 weeks Relieved by:  Belching Worsened by:  Nothing tried Ineffective treatments:  None tried Associated symptoms: nausea   Associated symptoms: no anorexia, no chills, no diarrhea, no fever and no vomiting     Past Medical History  Diagnosis Date  . Migraine   . PIH (pregnancy induced hypertension)     with all three pregnancies   Past Surgical History  Procedure Laterality Date  . Cesarean section  07/29/2004   History reviewed. No pertinent family history. History  Substance Use Topics  . Smoking status: Never Smoker   . Smokeless tobacco: Not on file  . Alcohol Use: No   OB History   Grav Para Term Preterm Abortions TAB SAB Ect Mult Living   6 4 4  1  1   4      Review of Systems  Constitutional: Negative for fever and chills.  Gastrointestinal: Positive for nausea and abdominal pain. Negative for vomiting, diarrhea and anorexia.  All other systems reviewed and are negative.     Allergies  Review of patient's allergies indicates no known allergies.  Home Medications   Prior to Admission medications   Medication Sig Start Date End Date Taking? Authorizing Provider  aspirin 81 MG tablet Take 1 tablet (81 mg total) by mouth daily. 05/29/14  Yes Peggy Constant, MD  labetalol (NORMODYNE) 200 MG tablet Take 1 tablet (200 mg total) by  mouth 2 (two) times daily. 05/29/14  Yes Peggy Constant, MD  prenatal vitamin w/FE, FA (PRENATAL 1 + 1) 27-1 MG TABS tablet Take 1 tablet by mouth daily at 12 noon.   Yes Historical Provider, MD   BP 156/97  Pulse 90  Temp(Src) 98 F (36.7 C) (Oral)  Resp 23  SpO2 98% Physical Exam  Vitals reviewed. Constitutional: She is oriented to person, place, and time. She appears well-developed and well-nourished.  HENT:  Head: Normocephalic and atraumatic.  Right Ear: External ear normal.  Left Ear: External ear normal.  Eyes: Conjunctivae and EOM are normal. Pupils are equal, round, and reactive to light.  Neck: Normal range of motion. Neck supple.  Cardiovascular: Normal rate, regular rhythm, normal heart sounds and intact distal pulses.   Pulmonary/Chest: Effort normal and breath sounds normal.  Abdominal: Soft. Bowel sounds are normal. She exhibits distension. There is tenderness in the epigastric area and left upper quadrant.  Musculoskeletal: Normal range of motion.  Neurological: She is alert and oriented to person, place, and time.  Skin: Skin is warm and dry.    ED Course  Procedures (including critical care time) Labs Review Labs Reviewed  CBC WITH DIFFERENTIAL - Abnormal; Notable for the following:    WBC 12.7 (*)    Neutro Abs 9.0 (*)    All other components within normal limits  COMPREHENSIVE METABOLIC PANEL - Abnormal; Notable for the following:    Sodium  134 (*)    Potassium 3.6 (*)    Glucose, Bld 117 (*)    Albumin 2.5 (*)    AST 157 (*)    ALT 172 (*)    Total Bilirubin <0.2 (*)    All other components within normal limits  LIPASE, BLOOD  URINALYSIS, ROUTINE W REFLEX MICROSCOPIC    Imaging Review Dg Chest 2 View  06/05/2014   CLINICAL DATA:  Abdominal pain. Twenty-six weeks pregnant. Initial encounter.  EXAM: CHEST  2 VIEW  COMPARISON:  01/03/2004 radiographs.  FINDINGS: There are low lung volumes attributed to gravid state. There is mild bibasilar atelectasis  and vascular congestion. No overt pulmonary edema, confluent airspace opacity or significant pleural effusion is seen. The heart size is stable. The osseous structures appear unremarkable.  IMPRESSION: Vascular congestion and bibasilar atelectasis attributed to gravid state. No acute cardiopulmonary process.   Electronically Signed   By: Roxy Horseman M.D.   On: 06/05/2014 22:35   EMERGENCY DEPARTMENT [place] PREGNANCY "Study: Limited Ultrasound of the Pelvis for Pregnancy"  INDICATIONS:Pregnancy(required) and Tachycardia Multiple views of the uterus and pelvic cavity were obtained in real-time with a multi-frequency probe.  APPROACH:Transabdominal   PERFORMED BY: Myself  IMAGES ARCHIVED?: Yes  LIMITATIONS: Body habitus  PREGNANCY FREE FLUID: None  ADNEXAL FINDINGS:  PREGNANCY FINDINGS: Fetal heart activity seen  INTERPRETATION: Viable intrauterine pregnancy  GESTATIONAL AGE, ESTIMATE: 25 wks  FETAL HEART RATE: 163      EKG Interpretation None     CRITICAL CARE Performed by: Mirian Mo   Total critical care time: 82  Critical care time was exclusive of separately billable procedures and treating other patients.  Critical care was necessary to treat or prevent imminent or life-threatening deterioration from early HELLP syndrome vs preeclampsia  Critical care was time spent personally by me on the following activities: development of treatment plan with patient and/or surrogate as well as nursing, discussions with consultants, evaluation of patient's response to treatment, examination of patient, obtaining history from patient or surrogate, ordering and performing treatments and interventions, ordering and review of laboratory studies, ordering and review of radiographic studies, pulse oximetry and re-evaluation of patient's condition.  MDM   Final diagnoses:  Hypertension affecting pregnancy, third trimester    38 y.o. female with pertinent PMH of G5P4 @ 20 by late  [place], prior hypertension of pregnancy presents with constant epigastric and LUQ abd pain x 1 hour.  Pain began spontaneously, patient has no history of same with prior pregnancies.  No lower abd pain, fluid per vagina, or other concerning findings. No contractions on toco.   She has had no prenatal care prior to this point.  On arrival today vitals signs and physical exam as above significant for hypertension. Patient has epigastric and left upper quadrant tenderness. Lab work as above concerning for developing hellp syndrome.  Ultrasound as above. Spoke with obstetrician and will transfer patient to womens.  Given labetalol, mag, betamethasone. Transferred in stable condition.    1. Hypertension affecting pregnancy, third trimester         Mirian Mo, MD 06/06/14 8469

## 2014-06-05 NOTE — ED Notes (Signed)
OB rapid response at bedside 

## 2014-06-05 NOTE — ED Notes (Signed)
[facility]athy Rapid Response OB at bedside

## 2014-06-05 NOTE — ED Notes (Signed)
Pt complains epigastric pain, no lower abd pain, no bleeding or discharge

## 2014-06-05 NOTE — ED Notes (Signed)
Pt transported to XRAY °

## 2014-06-05 NOTE — ED Notes (Signed)
Report called to [facility], and [facility] called.

## 2014-06-06 ENCOUNTER — Encounter (HOSPITAL_COMMUNITY): Payer: Self-pay | Admitting: Anesthesiology

## 2014-06-06 ENCOUNTER — Encounter (HOSPITAL_COMMUNITY): Payer: Self-pay | Admitting: *Deleted

## 2014-06-06 ENCOUNTER — Inpatient Hospital Stay (HOSPITAL_COMMUNITY): Payer: Medicaid Other | Admitting: Certified Registered"

## 2014-06-06 ENCOUNTER — Inpatient Hospital Stay (HOSPITAL_COMMUNITY): Payer: Medicaid Other

## 2014-06-06 ENCOUNTER — Encounter (HOSPITAL_COMMUNITY): Payer: Medicaid Other | Admitting: Certified Registered"

## 2014-06-06 ENCOUNTER — Encounter (HOSPITAL_COMMUNITY)
Admission: EM | Disposition: A | Discharge status: Home / Self Care | Payer: Self-pay | Source: Home / Self Care | Attending: Obstetrics & Gynecology

## 2014-06-06 DIAGNOSIS — O4102X0 Oligohydramnios, second trimester, not applicable or unspecified: Secondary | ICD-10-CM | POA: Diagnosis present

## 2014-06-06 DIAGNOSIS — Z7982 Long term (current) use of aspirin: Secondary | ICD-10-CM | POA: Diagnosis not present

## 2014-06-06 DIAGNOSIS — Z3A26 26 weeks gestation of pregnancy: Secondary | ICD-10-CM | POA: Diagnosis not present

## 2014-06-06 DIAGNOSIS — N858 Other specified noninflammatory disorders of uterus: Secondary | ICD-10-CM | POA: Diagnosis present

## 2014-06-06 DIAGNOSIS — O1490 Unspecified pre-eclampsia, unspecified trimester: Secondary | ICD-10-CM | POA: Diagnosis present

## 2014-06-06 DIAGNOSIS — O3421 Maternal care for scar from previous cesarean delivery: Secondary | ICD-10-CM | POA: Diagnosis present

## 2014-06-06 DIAGNOSIS — G43909 Migraine, unspecified, not intractable, without status migrainosus: Secondary | ICD-10-CM | POA: Diagnosis present

## 2014-06-06 DIAGNOSIS — O1422 HELLP syndrome (HELLP), second trimester: Secondary | ICD-10-CM | POA: Diagnosis present

## 2014-06-06 DIAGNOSIS — O99112 Other diseases of the blood and blood-forming organs and certain disorders involving the immune mechanism complicating pregnancy, second trimester: Secondary | ICD-10-CM

## 2014-06-06 DIAGNOSIS — O09522 Supervision of elderly multigravida, second trimester: Secondary | ICD-10-CM | POA: Diagnosis not present

## 2014-06-06 DIAGNOSIS — O365120 Maternal care for known or suspected placental insufficiency, second trimester, not applicable or unspecified: Secondary | ICD-10-CM | POA: Diagnosis present

## 2014-06-06 DIAGNOSIS — Z302 Encounter for sterilization: Secondary | ICD-10-CM | POA: Diagnosis not present

## 2014-06-06 DIAGNOSIS — O365920 Maternal care for other known or suspected poor fetal growth, second trimester, not applicable or unspecified: Secondary | ICD-10-CM | POA: Diagnosis present

## 2014-06-06 DIAGNOSIS — Z79899 Other long term (current) drug therapy: Secondary | ICD-10-CM | POA: Diagnosis not present

## 2014-06-06 LAB — LACTATE DEHYDROGENASE
LDH: 903 U/L — ABNORMAL HIGH (ref 94–250)
LDH: 803 U/L — AB (ref 94–250)

## 2014-06-06 LAB — HIV ANTIBODY (ROUTINE TESTING W REFLEX): HIV: NONREACTIVE

## 2014-06-06 LAB — APTT: aPTT: 29 seconds (ref 24–37)

## 2014-06-06 LAB — CBC
HCT: 37.4 % (ref 36.0–46.0)
HCT: 34.3 % — ABNORMAL LOW (ref 36.0–46.0)
HEMATOCRIT: 38.5 % (ref 36.0–46.0)
HEMATOCRIT: 37.9 % (ref 36.0–46.0)
HEMATOCRIT: 38.7 % (ref 36.0–46.0)
HEMOGLOBIN: 13.2 g/dL (ref 12.0–15.0)
Hemoglobin: 13.7 g/dL (ref 12.0–15.0)
Hemoglobin: 13.5 g/dL (ref 12.0–15.0)
Hemoglobin: 13.7 g/dL (ref 12.0–15.0)
Hemoglobin: 12.1 g/dL (ref 12.0–15.0)
MCH: 31.6 pg (ref 26.0–34.0)
MCH: 31.7 pg (ref 26.0–34.0)
MCH: 31.6 pg (ref 26.0–34.0)
MCH: 31.7 pg (ref 26.0–34.0)
MCH: 31.3 pg (ref 26.0–34.0)
MCHC: 35.6 g/dL (ref 30.0–36.0)
MCHC: 35.6 g/dL (ref 30.0–36.0)
MCHC: 35.3 g/dL (ref 30.0–36.0)
MCHC: 35.4 g/dL (ref 30.0–36.0)
MCHC: 35.3 g/dL (ref 30.0–36.0)
MCV: 88.9 fL (ref 78.0–100.0)
MCV: 89 fL (ref 78.0–100.0)
MCV: 89.5 fL (ref 78.0–100.0)
MCV: 89.6 fL (ref 78.0–100.0)
MCV: 88.6 fL (ref 78.0–100.0)
PLATELETS: 107 10*3/uL — AB (ref 150–400)
Platelets: 93 10*3/uL — ABNORMAL LOW (ref 150–400)
Platelets: 85 10*3/uL — ABNORMAL LOW (ref 150–400)
Platelets: 86 10*3/uL — ABNORMAL LOW (ref 150–400)
Platelets: 87 10*3/uL — ABNORMAL LOW (ref 150–400)
RBC: 4.33 MIL/uL (ref 3.87–5.11)
RBC: 4.26 MIL/uL (ref 3.87–5.11)
RBC: 4.18 MIL/uL (ref 3.87–5.11)
RBC: 4.32 MIL/uL (ref 3.87–5.11)
RBC: 3.87 MIL/uL (ref 3.87–5.11)
RDW: 13.2 % (ref 11.5–15.5)
RDW: 13.4 % (ref 11.5–15.5)
RDW: 13.4 % (ref 11.5–15.5)
RDW: 13.3 % (ref 11.5–15.5)
RDW: 13.5 % (ref 11.5–15.5)
WBC: 18.2 10*3/uL — ABNORMAL HIGH (ref 4.0–10.5)
WBC: 15.3 10*3/uL — ABNORMAL HIGH (ref 4.0–10.5)
WBC: 14.7 10*3/uL — ABNORMAL HIGH (ref 4.0–10.5)
WBC: 15.6 10*3/uL — ABNORMAL HIGH (ref 4.0–10.5)
WBC: 19.3 10*3/uL — AB (ref 4.0–10.5)

## 2014-06-06 LAB — DIC (DISSEMINATED INTRAVASCULAR COAGULATION) PANEL
APTT: 28 s (ref 24–37)
D-Dimer, Quant: 2.55 ug/mL-FEU — ABNORMAL HIGH (ref 0.00–0.48)
FIBRINOGEN: 338 mg/dL (ref 204–475)
Platelets: 88 10*3/uL — ABNORMAL LOW (ref 150–400)
SMEAR REVIEW: NONE SEEN

## 2014-06-06 LAB — URIC ACID
Uric Acid, Serum: 6.6 mg/dL (ref 2.4–7.0)
Uric Acid, Serum: 6.6 mg/dL (ref 2.4–7.0)

## 2014-06-06 LAB — ABO/RH: ABO/RH(D): O POS

## 2014-06-06 LAB — COMPREHENSIVE METABOLIC PANEL
ALBUMIN: 2.5 g/dL — AB (ref 3.5–5.2)
ALBUMIN: 2.2 g/dL — AB (ref 3.5–5.2)
ALT: 455 U/L — ABNORMAL HIGH (ref 0–35)
ALT: 403 U/L — ABNORMAL HIGH (ref 0–35)
ALT: 391 U/L — ABNORMAL HIGH (ref 0–35)
ANION GAP: 17 — AB (ref 5–15)
ANION GAP: 15 (ref 5–15)
AST: 577 U/L — ABNORMAL HIGH (ref 0–37)
AST: 488 U/L — ABNORMAL HIGH (ref 0–37)
AST: 417 U/L — AB (ref 0–37)
Albumin: 2.2 g/dL — ABNORMAL LOW (ref 3.5–5.2)
Alkaline Phosphatase: 105 U/L (ref 39–117)
Alkaline Phosphatase: 99 U/L (ref 39–117)
Alkaline Phosphatase: 97 U/L (ref 39–117)
Anion gap: 15 (ref 5–15)
BILIRUBIN TOTAL: 0.7 mg/dL (ref 0.3–1.2)
BUN: 14 mg/dL (ref 6–23)
BUN: 14 mg/dL (ref 6–23)
BUN: 14 mg/dL (ref 6–23)
CALCIUM: 8.7 mg/dL (ref 8.4–10.5)
CALCIUM: 8.2 mg/dL — AB (ref 8.4–10.5)
CALCIUM: 8.1 mg/dL — AB (ref 8.4–10.5)
CO2: 17 mEq/L — ABNORMAL LOW (ref 19–32)
CO2: 17 mEq/L — ABNORMAL LOW (ref 19–32)
CO2: 17 mEq/L — ABNORMAL LOW (ref 19–32)
CREATININE: 0.44 mg/dL — AB (ref 0.50–1.10)
CREATININE: 0.45 mg/dL — AB (ref 0.50–1.10)
Chloride: 103 mEq/L (ref 96–112)
Chloride: 104 mEq/L (ref 96–112)
Chloride: 105 mEq/L (ref 96–112)
Creatinine, Ser: 0.44 mg/dL — ABNORMAL LOW (ref 0.50–1.10)
GFR calc Af Amer: >90 mL/min (ref >90)
GFR calc Af Amer: >90 mL/min (ref >90)
GFR calc Af Amer: >90 mL/min (ref >90)
GFR calc non Af Amer: >90 mL/min (ref >90)
GFR calc non Af Amer: >90 mL/min (ref >90)
GFR calc non Af Amer: >90 mL/min (ref >90)
Glucose, Bld: 117 mg/dL — ABNORMAL HIGH (ref 70–99)
Glucose, Bld: 141 mg/dL — ABNORMAL HIGH (ref 70–99)
Glucose, Bld: 147 mg/dL — ABNORMAL HIGH (ref 70–99)
POTASSIUM: 4.4 meq/L (ref 3.7–5.3)
Potassium: 4.3 mEq/L (ref 3.7–5.3)
Potassium: 4.6 mEq/L (ref 3.7–5.3)
SODIUM: 137 meq/L (ref 137–147)
Sodium: 137 mEq/L (ref 137–147)
Sodium: 136 mEq/L — ABNORMAL LOW (ref 137–147)
TOTAL PROTEIN: 5.9 g/dL — AB (ref 6.0–8.3)
TOTAL PROTEIN: 5.6 g/dL — AB (ref 6.0–8.3)
Total Bilirubin: 0.6 mg/dL (ref 0.3–1.2)
Total Bilirubin: 0.5 mg/dL (ref 0.3–1.2)
Total Protein: 5.5 g/dL — ABNORMAL LOW (ref 6.0–8.3)

## 2014-06-06 LAB — PREPARE RBC (CROSSMATCH)

## 2014-06-06 LAB — URINE MICROSCOPIC-ADD ON

## 2014-06-06 LAB — URINALYSIS, ROUTINE W REFLEX MICROSCOPIC
Bilirubin Urine: NEGATIVE
GLUCOSE, UA: NEGATIVE mg/dL
Ketones, ur: NEGATIVE mg/dL
LEUKOCYTES UA: NEGATIVE
Nitrite: POSITIVE — AB
PH: 6.5 (ref 5.0–8.0)
Specific Gravity, Urine: 1.02 (ref 1.005–1.030)
Urobilinogen, UA: 0.2 mg/dL (ref 0.0–1.0)

## 2014-06-06 LAB — PLATELET COUNT: Platelets: 85 10*3/uL — ABNORMAL LOW (ref 150–400)

## 2014-06-06 LAB — PROTEIN / CREATININE RATIO, URINE
CREATININE, URINE: 66.87 mg/dL
Protein Creatinine Ratio: 20.49 — ABNORMAL HIGH (ref 0.00–0.15)
TOTAL PROTEIN, URINE: 1370 mg/dL

## 2014-06-06 LAB — RPR

## 2014-06-06 LAB — PROTIME-INR
INR: 0.96 (ref 0.00–1.49)
Prothrombin Time: 12.8 seconds (ref 11.6–15.2)

## 2014-06-06 LAB — DIC (DISSEMINATED INTRAVASCULAR COAGULATION)PANEL
INR: 0.96 (ref 0.00–1.49)
Prothrombin Time: 12.9 seconds (ref 11.6–15.2)

## 2014-06-06 LAB — D-DIMER, QUANTITATIVE: D-Dimer, Quant: 2.86 ug/mL-FEU — ABNORMAL HIGH (ref 0.00–0.48)

## 2014-06-06 LAB — FIBRINOGEN: Fibrinogen: 335 mg/dL (ref 204–475)

## 2014-06-06 SURGERY — Surgical Case
Anesthesia: Spinal

## 2014-06-06 MED ORDER — NALBUPHINE HCL 10 MG/ML IJ SOLN: 5.0000 mg | INTRAMUSCULAR | Status: DC | PRN

## 2014-06-06 MED ORDER — ZOLPIDEM TARTRATE 5 MG PO TABS: 5.0000 mg | ORAL_TABLET | Freq: Every evening | ORAL | Status: DC | PRN

## 2014-06-06 MED ORDER — SENNOSIDES-DOCUSATE SODIUM 8.6-50 MG PO TABS
2.0000 | ORAL_TABLET | ORAL | Status: DC
  Administered 2014-06-06 – 2014-06-08 (×3): 2 via ORAL
  Filled 2014-06-06 (×3): qty 2

## 2014-06-06 MED ORDER — PHENYLEPHRINE 8 MG IN D5W 100 ML (0.08MG/ML) PREMIX OPTIME
INJECTION | INTRAVENOUS | Status: AC
  Filled 2014-06-06: qty 100

## 2014-06-06 MED ORDER — OXYTOCIN 10 UNIT/ML IJ SOLN
40.0000 [IU] | INTRAMUSCULAR | Status: DC | PRN
  Administered 2014-06-06: 40 [IU] via INTRAVENOUS

## 2014-06-06 MED ORDER — CEFAZOLIN SODIUM-DEXTROSE 2-3 GM-% IV SOLR
INTRAVENOUS | Status: DC | PRN
  Administered 2014-06-06: 2 g via INTRAVENOUS

## 2014-06-06 MED ORDER — ACETAMINOPHEN 500 MG PO TABS
1000.0000 mg | ORAL_TABLET | Freq: Four times a day (QID) | ORAL | Status: AC
  Administered 2014-06-06 – 2014-06-07 (×4): 1000 mg via ORAL
  Filled 2014-06-06 (×4): qty 2

## 2014-06-06 MED ORDER — ONDANSETRON HCL 4 MG PO TABS
4.0000 mg | ORAL_TABLET | ORAL | Status: DC | PRN
Start: 2014-06-06 — End: 2014-06-09

## 2014-06-06 MED ORDER — SIMETHICONE 80 MG PO CHEW
80.0000 mg | CHEWABLE_TABLET | Freq: Three times a day (TID) | ORAL | Status: DC
  Administered 2014-06-07 – 2014-06-09 (×6): 80 mg via ORAL
  Filled 2014-06-06 (×6): qty 1

## 2014-06-06 MED ORDER — OXYTOCIN 40 UNITS IN LACTATED RINGERS INFUSION - SIMPLE MED: 62.5000 mL/h | INTRAVENOUS | Status: DC

## 2014-06-06 MED ORDER — ASPIRIN 81 MG PO TABS: 81.0000 mg | ORAL_TABLET | Freq: Every day | ORAL | Status: DC

## 2014-06-06 MED ORDER — MAGNESIUM SULFATE 40 G IN LACTATED RINGERS - SIMPLE
2.0000 g/h | INTRAVENOUS | Status: DC
  Administered 2014-06-06: 1 g/h via INTRAVENOUS
  Filled 2014-06-06: qty 500

## 2014-06-06 MED ORDER — ONDANSETRON HCL 4 MG/2ML IJ SOLN: 4.0000 mg | INTRAMUSCULAR | Status: DC | PRN

## 2014-06-06 MED ORDER — MORPHINE SULFATE (PF) 0.5 MG/ML IJ SOLN
INTRAMUSCULAR | Status: DC | PRN
  Administered 2014-06-06: .15 mg via INTRATHECAL

## 2014-06-06 MED ORDER — OXYTOCIN 10 UNIT/ML IJ SOLN
INTRAMUSCULAR | Status: AC
  Filled 2014-06-06: qty 4

## 2014-06-06 MED ORDER — NALBUPHINE HCL 10 MG/ML IJ SOLN: 5.0000 mg | Freq: Once | INTRAMUSCULAR | Status: AC | PRN

## 2014-06-06 MED ORDER — MAGNESIUM SULFATE 40 G IN LACTATED RINGERS - SIMPLE
2.0000 g/h | INTRAVENOUS | Status: AC
  Administered 2014-06-07: 2 g/h via INTRAVENOUS
  Filled 2014-06-06 (×2): qty 500

## 2014-06-06 MED ORDER — OXYCODONE-ACETAMINOPHEN 5-325 MG PO TABS: 1.0000 | ORAL_TABLET | ORAL | Status: DC | PRN

## 2014-06-06 MED ORDER — MIDAZOLAM HCL 2 MG/2ML IJ SOLN: 0.5000 mg | Freq: Once | INTRAMUSCULAR | Status: DC | PRN

## 2014-06-06 MED ORDER — NALOXONE HCL 0.4 MG/ML IJ SOLN: 0.4000 mg | INTRAMUSCULAR | Status: DC | PRN

## 2014-06-06 MED ORDER — DIBUCAINE 1 % RE OINT
1.0000 | TOPICAL_OINTMENT | RECTAL | Status: DC | PRN
Start: 2014-06-06 — End: 2014-06-09

## 2014-06-06 MED ORDER — MEPERIDINE HCL 25 MG/ML IJ SOLN: 6.2500 mg | INTRAMUSCULAR | Status: DC | PRN

## 2014-06-06 MED ORDER — OXYTOCIN BOLUS FROM INFUSION: 500.0000 mL | INTRAVENOUS | Status: DC

## 2014-06-06 MED ORDER — LACTATED RINGERS IV SOLN
INTRAVENOUS | Status: DC | PRN
  Administered 2014-06-06: 09:00:00 via INTRAVENOUS

## 2014-06-06 MED ORDER — SIMETHICONE 80 MG PO CHEW: 80.0000 mg | CHEWABLE_TABLET | ORAL | Status: DC | PRN

## 2014-06-06 MED ORDER — LABETALOL HCL 5 MG/ML IV SOLN
20.0000 mg | Freq: Once | INTRAVENOUS | Status: AC
  Administered 2014-06-06: 20 mg via INTRAVENOUS
  Filled 2014-06-06: qty 4

## 2014-06-06 MED ORDER — SIMETHICONE 80 MG PO CHEW
80.0000 mg | CHEWABLE_TABLET | ORAL | Status: DC
  Administered 2014-06-06 – 2014-06-08 (×3): 80 mg via ORAL
  Filled 2014-06-06 (×3): qty 1

## 2014-06-06 MED ORDER — DIPHENHYDRAMINE HCL 25 MG PO CAPS: 25.0000 mg | ORAL_CAPSULE | Freq: Four times a day (QID) | ORAL | Status: DC | PRN

## 2014-06-06 MED ORDER — TETANUS-DIPHTH-ACELL PERTUSSIS 5-2.5-18.5 LF-MCG/0.5 IM SUSP
0.5000 mL | Freq: Once | INTRAMUSCULAR | Status: DC
  Filled 2014-06-06: qty 0.5

## 2014-06-06 MED ORDER — PROMETHAZINE HCL 25 MG/ML IJ SOLN: 6.2500 mg | INTRAMUSCULAR | Status: DC | PRN

## 2014-06-06 MED ORDER — LACTATED RINGERS IV SOLN
INTRAVENOUS | Status: DC
  Administered 2014-06-06: 01:00:00 via INTRAVENOUS

## 2014-06-06 MED ORDER — SCOPOLAMINE 1 MG/3DAYS TD PT72
MEDICATED_PATCH | TRANSDERMAL | Status: AC
  Administered 2014-06-06: 1.5 mg via TRANSDERMAL
  Filled 2014-06-06: qty 1

## 2014-06-06 MED ORDER — MENTHOL 3 MG MT LOZG: 1.0000 | LOZENGE | OROMUCOSAL | Status: DC | PRN

## 2014-06-06 MED ORDER — IBUPROFEN 600 MG PO TABS
600.0000 mg | ORAL_TABLET | Freq: Four times a day (QID) | ORAL | Status: DC
  Administered 2014-06-06 – 2014-06-09 (×10): 600 mg via ORAL
  Filled 2014-06-06 (×10): qty 1

## 2014-06-06 MED ORDER — CEFAZOLIN SODIUM-DEXTROSE 2-3 GM-% IV SOLR
INTRAVENOUS | Status: AC
  Filled 2014-06-06: qty 200

## 2014-06-06 MED ORDER — LABETALOL HCL 5 MG/ML IV SOLN
5.0000 mg | INTRAVENOUS | Status: DC | PRN
  Administered 2014-06-06: 5 mg via INTRAVENOUS
  Filled 2014-06-06: qty 4

## 2014-06-06 MED ORDER — ONDANSETRON HCL 4 MG/2ML IJ SOLN
INTRAMUSCULAR | Status: DC | PRN
  Administered 2014-06-06: 4 mg via INTRAVENOUS

## 2014-06-06 MED ORDER — FENTANYL CITRATE 0.05 MG/ML IJ SOLN
INTRAMUSCULAR | Status: AC
  Filled 2014-06-06: qty 2

## 2014-06-06 MED ORDER — PRENATAL PLUS 27-1 MG PO TABS: 1.0000 | ORAL_TABLET | Freq: Every day | ORAL | Status: DC

## 2014-06-06 MED ORDER — DIPHENHYDRAMINE HCL 25 MG PO CAPS: 25.0000 mg | ORAL_CAPSULE | ORAL | Status: DC | PRN

## 2014-06-06 MED ORDER — NALOXONE HCL 1 MG/ML IJ SOLN
1.0000 ug/kg/h | INTRAVENOUS | Status: DC | PRN
  Filled 2014-06-06: qty 2

## 2014-06-06 MED ORDER — LANOLIN HYDROUS EX OINT: 1.0000 "application " | TOPICAL_OINTMENT | CUTANEOUS | Status: DC | PRN

## 2014-06-06 MED ORDER — BUPIVACAINE IN DEXTROSE 0.75-8.25 % IT SOLN
INTRATHECAL | Status: DC | PRN
  Administered 2014-06-06: 1.4 mL via INTRATHECAL

## 2014-06-06 MED ORDER — WITCH HAZEL-GLYCERIN EX PADS: 1.0000 "application " | MEDICATED_PAD | CUTANEOUS | Status: DC | PRN

## 2014-06-06 MED ORDER — OXYCODONE-ACETAMINOPHEN 5-325 MG PO TABS: 2.0000 | ORAL_TABLET | ORAL | Status: DC | PRN

## 2014-06-06 MED ORDER — PHENYLEPHRINE 8 MG IN D5W 100 ML (0.08MG/ML) PREMIX OPTIME
INJECTION | INTRAVENOUS | Status: DC | PRN
  Administered 2014-06-06: 40 ug/min via INTRAVENOUS

## 2014-06-06 MED ORDER — FENTANYL CITRATE 0.05 MG/ML IJ SOLN
25.0000 ug | INTRAMUSCULAR | Status: DC | PRN
  Administered 2014-06-06: 50 ug via INTRAVENOUS

## 2014-06-06 MED ORDER — OXYTOCIN 40 UNITS IN LACTATED RINGERS INFUSION - SIMPLE MED: 62.5000 mL/h | INTRAVENOUS | Status: AC

## 2014-06-06 MED ORDER — OXYCODONE-ACETAMINOPHEN 5-325 MG PO TABS
2.0000 | ORAL_TABLET | ORAL | Status: DC | PRN
Start: 2014-06-06 — End: 2014-06-06

## 2014-06-06 MED ORDER — MORPHINE SULFATE 0.5 MG/ML IJ SOLN
INTRAMUSCULAR | Status: AC
  Filled 2014-06-06: qty 10

## 2014-06-06 MED ORDER — ONDANSETRON HCL 4 MG/2ML IJ SOLN: 4.0000 mg | Freq: Four times a day (QID) | INTRAMUSCULAR | Status: DC | PRN

## 2014-06-06 MED ORDER — LACTATED RINGERS IV SOLN: 500.0000 mL | INTRAVENOUS | Status: DC | PRN

## 2014-06-06 MED ORDER — SCOPOLAMINE 1 MG/3DAYS TD PT72
1.0000 | MEDICATED_PATCH | Freq: Once | TRANSDERMAL | Status: AC
  Administered 2014-06-06: 1.5 mg via TRANSDERMAL

## 2014-06-06 MED ORDER — LACTATED RINGERS IV SOLN
INTRAVENOUS | Status: DC
  Administered 2014-06-06 – 2014-06-07 (×3): via INTRAVENOUS

## 2014-06-06 MED ORDER — LIDOCAINE HCL (PF) 1 % IJ SOLN: 30.0000 mL | INTRAMUSCULAR | Status: DC | PRN

## 2014-06-06 MED ORDER — ACETAMINOPHEN 325 MG PO TABS: 650.0000 mg | ORAL_TABLET | ORAL | Status: DC | PRN

## 2014-06-06 MED ORDER — CITRIC ACID-SODIUM CITRATE 334-500 MG/5ML PO SOLN
30.0000 mL | ORAL | Status: DC | PRN
  Administered 2014-06-06: 30 mL via ORAL
  Filled 2014-06-06: qty 15

## 2014-06-06 MED ORDER — SODIUM CHLORIDE 0.9 % IJ SOLN: 3.0000 mL | INTRAMUSCULAR | Status: DC | PRN

## 2014-06-06 MED ORDER — LABETALOL HCL 200 MG PO TABS
200.0000 mg | ORAL_TABLET | Freq: Two times a day (BID) | ORAL | Status: DC
  Administered 2014-06-06: 200 mg via ORAL
  Filled 2014-06-06 (×2): qty 1

## 2014-06-06 MED ORDER — ONDANSETRON HCL 4 MG/2ML IJ SOLN
INTRAMUSCULAR | Status: AC
  Filled 2014-06-06: qty 2

## 2014-06-06 MED ORDER — BETAMETHASONE SOD PHOS & ACET 6 (3-3) MG/ML IJ SUSP
12.0000 mg | Freq: Once | INTRAMUSCULAR | Status: AC
  Administered 2014-06-06: 12 mg via INTRAMUSCULAR
  Filled 2014-06-06: qty 2

## 2014-06-06 MED ORDER — DIPHENHYDRAMINE HCL 50 MG/ML IJ SOLN: 12.5000 mg | INTRAMUSCULAR | Status: DC | PRN

## 2014-06-06 MED ORDER — FENTANYL CITRATE 0.05 MG/ML IJ SOLN
INTRAMUSCULAR | Status: DC | PRN
  Administered 2014-06-06: 25 ug via INTRATHECAL

## 2014-06-06 MED ORDER — ONDANSETRON HCL 4 MG/2ML IJ SOLN: 4.0000 mg | Freq: Three times a day (TID) | INTRAMUSCULAR | Status: DC | PRN

## 2014-06-06 MED ORDER — PRENATAL MULTIVITAMIN CH
1.0000 | ORAL_TABLET | Freq: Every day | ORAL | Status: DC
  Administered 2014-06-07 – 2014-06-08 (×2): 1 via ORAL
  Filled 2014-06-06 (×2): qty 1

## 2014-06-06 SURGICAL SUPPLY — 33 items
CLAMP CORD UMBIL (MISCELLANEOUS) IMPLANT
CLIP FILSHIE TUBAL LIGA STRL (Clip) ×2 IMPLANT
CLOTH BEACON ORANGE TIMEOUT ST (SAFETY) ×3 IMPLANT
COVER LIGHT HANDLE  1/PK (MISCELLANEOUS) ×4
COVER LIGHT HANDLE 1/PK (MISCELLANEOUS) ×2 IMPLANT
DRAIN JACKSON PRT FLT 7MM (DRAIN) IMPLANT
DRAPE SHEET LG 3/4 BI-LAMINATE (DRAPES) IMPLANT
DRSG OPSITE POSTOP 4X10 (GAUZE/BANDAGES/DRESSINGS) ×3 IMPLANT
DURAPREP 26ML APPLICATOR (WOUND CARE) ×3 IMPLANT
ELECT REM PT RETURN 9FT ADLT (ELECTROSURGICAL) ×3
ELECTRODE REM PT RTRN 9FT ADLT (ELECTROSURGICAL) ×1 IMPLANT
EVACUATOR SILICONE 100CC (DRAIN) IMPLANT
EXTRACTOR VACUUM M CUP 4 TUBE (SUCTIONS) IMPLANT
EXTRACTOR VACUUM M CUP 4' TUBE (SUCTIONS)
GLOVE BIO SURGEON STRL SZ7 (GLOVE) ×3 IMPLANT
GLOVE BIOGEL PI IND STRL 7.0 (GLOVE) ×1 IMPLANT
GLOVE BIOGEL PI INDICATOR 7.0 (GLOVE) ×2
GOWN STRL REUS W/TWL LRG LVL3 (GOWN DISPOSABLE) ×6 IMPLANT
KIT ABG SYR 3ML LUER SLIP (SYRINGE) IMPLANT
NDL HYPO 25X5/8 SAFETYGLIDE (NEEDLE) ×1 IMPLANT
NEEDLE HYPO 25X5/8 SAFETYGLIDE (NEEDLE) ×3 IMPLANT
NS IRRIG 1000ML POUR BTL (IV SOLUTION) ×3 IMPLANT
PACK C SECTION WH (CUSTOM PROCEDURE TRAY) ×3 IMPLANT
PAD OB MATERNITY 4.3X12.25 (PERSONAL CARE ITEMS) ×3 IMPLANT
RTRCTR C-SECT PINK 25CM LRG (MISCELLANEOUS) ×3 IMPLANT
SUT PLAIN 2 0 XLH (SUTURE) ×2 IMPLANT
SUT VIC AB 0 CT1 36 (SUTURE) ×4 IMPLANT
SUT VIC AB 0 CTX 36 (SUTURE) ×15
SUT VIC AB 0 CTX36XBRD ANBCTRL (SUTURE) ×5 IMPLANT
SUT VIC AB 4-0 KS 27 (SUTURE) ×3 IMPLANT
TOWEL OR 17X24 6PK STRL BLUE (TOWEL DISPOSABLE) ×3 IMPLANT
TRAY FOLEY CATH 14FR (SET/KITS/TRAYS/PACK) ×3 IMPLANT
WATER STERILE IRR 1000ML POUR (IV SOLUTION) ×3 IMPLANT

## 2014-06-06 NOTE — Anesthesia Preprocedure Evaluation (Signed)
Anesthesia Evaluation  Patient identified by MRN, date of birth, ID band Patient awake    Reviewed: Allergy & Precautions, H&P , NPO status , Patient's Chart, lab work & pertinent test results  Airway Mallampati: II      Dental   Pulmonary  breath sounds clear to auscultation        Cardiovascular Exercise Tolerance: Good hypertension, Rhythm:regular Rate:Normal     Neuro/Psych  Headaches,    GI/Hepatic   Endo/Other  Morbid obesity  Renal/GU      Musculoskeletal   Abdominal   Peds  Hematology   Anesthesia Other Findings HELLP  Reproductive/Obstetrics (+) Pregnancy                           Anesthesia Physical Anesthesia Plan  ASA: III and emergent  Anesthesia Plan: Spinal   Post-op Pain Management:    Induction:   Airway Management Planned:   Additional Equipment:   Intra-op Plan:   Post-operative Plan:   Informed Consent: I have reviewed the patients History and Physical, chart, labs and discussed the procedure including the risks, benefits and alternatives for the proposed anesthesia with the patient or authorized representative who has indicated his/her understanding and acceptance.     Plan Discussed with: Anesthesiologist, CRNA and Surgeon  Anesthesia Plan Comments:         Anesthesia Quick Evaluation Will do general if platelets fall below 80k

## 2014-06-06 NOTE — H&P (Signed)
LABOR ADMISSION HISTORY AND PHYSICAL  Martha Paul is a 38 y.o. female [phone] with IUP at [redacted]w[redacted]d by LMP presenting with pre-eclampsia.   Pt presents w/ that tonight, suddenly around 8 pm she experienced severe and sudden onset epigastric pain. This caused her to vomit 3 times and thus she presented to [facility] ER. At [facility] ER she noted she had scotomas and blurry vision, but denied HA, SOB, chest pain. She was noted to have blood pressures in 200s/110s and labs were performed which were c/w HELLP syndrome (AST/ALT 157/172). She was given labetalol 20 mg IV x 1 with improving BP < 160/100 and was started on magnesium. She was given 1 dose of BMZ ~ 2300. Shwas then transferred to [facility] hospital for further management of severe pre-eclampsia.  Upon arrival to [facility] hospital, she reports resolution of abdominal pain and scotoma. She reports +FMs, No LOF, no VB, no blurry vision, headaches or peripheral edema.   Of note, she was supposed to start labetalol 200 mg BID and ASA 81 mg on 9/28, but this rx was never filled by her pharmacy.  Dating: By LMP --->  Estimated Date of Delivery: 09/11/14  Sono:    Normal anatomy  Prenatal History/Complications: PIH, baseline 24 hr urine protein 3434 mg H/o PIH w/ IOL at 37 wks with previous pregnancy AMA H/o migraine w/ aura Elevated risk for down sydrome 1:107, declined further testing H/o C-section w/ successful VBAC  Past Medical History: Past Medical History  Diagnosis Date  . Migraine   . PIH (pregnancy induced hypertension)     with all three pregnancies    Past Surgical History: Past Surgical History  Procedure Laterality Date  . Cesarean section  07/29/2004    Obstetrical History: OB History   Grav Para Term Preterm Abortions TAB SAB Ect Mult Living   6 4 4  1  1   4       Gynecological History: OB History   Grav Para Term Preterm Abortions TAB SAB Ect Mult Living   6 4 4  1  1   4       Social  History: History   Social History  . Marital Status: Single    Spouse Name: N/A    Number of Children: N/A  . Years of Education: N/A   Social History Main Topics  . Smoking status: Never Smoker   . Smokeless tobacco: None  . Alcohol Use: No  . Drug Use: None  . Sexual Activity: None   Other Topics Concern  . None   Social History Narrative  . None    Family History: History reviewed. No pertinent family history.  Allergies: No Known Allergies  Prescriptions prior to admission  Medication Sig Dispense Refill  . aspirin 81 MG tablet Take 1 tablet (81 mg total) by mouth daily.  30 tablet  6  . labetalol (NORMODYNE) 200 MG tablet Take 1 tablet (200 mg total) by mouth 2 (two) times daily.  60 tablet  3  . prenatal vitamin w/FE, FA (PRENATAL 1 + 1) 27-1 MG TABS tablet Take 1 tablet by mouth daily at 12 noon.         Review of Systems   All systems reviewed and negative except as stated in HPI  Blood pressure 153/93, pulse 89, temperature 98 F (36.7 C), temperature source Oral, resp. rate 23, SpO2 99.00%. General appearance: alert, cooperative and appears stated age Lungs: clear to auscultation bilaterally Heart: regular rate and rhythm  Abdomen: soft, non-tender; bowel sounds normal Extremities: Homans sign is negative, no sign of DVT DTR's 3+ Fetal monitoringBaseline: 150 bpm, Variability: Fair to Good, Accelerations: Reactive and Decelerations: Variable: mild Uterine activityNone     Prenatal labs: ABO, Rh: O/Positive/-- (08/10 0000) Antibody: Negative (08/10 0000) Rubella:  Immune RPR: Nonreactive (08/10 0000)  HBsAg: Negative (08/10 0000)  HIV: Non-reactive (08/10 0000)  GBS:   Pending 1 hr Glucola not yet performed Genetic screening  Elevated risk for down syndrome 1:107 Anatomy US normal   Prenatal Transfer Tool  Maternal Diabetes: Unknown Genetic Screening: Abnormal:  Results: Elevated risk of Trisomy 21 Maternal Ultrasounds/Referrals:  Normal Fetal Ultrasounds or other Referrals:  None Maternal Substance Abuse:  No Significant Maternal Medications:  None  (Should have started labetalol and ASA) Significant Maternal Lab Results: Lab values include: Other: Elevated HELLP labs, AST/ALT 157/172     Results for orders placed during the hospital encounter of 06/05/14 (from the past 24 hour(s))  CBC WITH DIFFERENTIAL   Collection Time    06/05/14  9:56 PM      Result Value Ref Range   WBC 12.7 (*) 4.0 - 10.5 K/uL   RBC 4.64  3.87 - 5.11 MIL/uL   Hemoglobin 14.3  12.0 - 15.0 g/dL   HCT 16.1  09.6 - 04.5 %   MCV 88.4  78.0 - 100.0 fL   MCH 30.8  26.0 - 34.0 pg   MCHC 34.9  30.0 - 36.0 g/dL   RDW 40.9  81.1 - 91.4 %   Platelets 196  150 - 400 K/uL   Neutrophils Relative % 71  43 - 77 %   Neutro Abs 9.0 (*) 1.7 - 7.7 K/uL   Lymphocytes Relative 23  12 - 46 %   Lymphs Abs 2.9  0.7 - 4.0 K/uL   Monocytes Relative 6  3 - 12 %   Monocytes Absolute 0.7  0.1 - 1.0 K/uL   Eosinophils Relative 0  0 - 5 %   Eosinophils Absolute 0.0  0.0 - 0.7 K/uL   Basophils Relative 0  0 - 1 %   Basophils Absolute 0.0  0.0 - 0.1 K/uL  COMPREHENSIVE METABOLIC PANEL   Collection Time    06/05/14  9:56 PM      Result Value Ref Range   Sodium 134 (*) 137 - 147 mEq/L   Potassium 3.6 (*) 3.7 - 5.3 mEq/L   Chloride 100  96 - 112 mEq/L   CO2 21  19 - 32 mEq/L   Glucose, Bld 117 (*) 70 - 99 mg/dL   BUN 14  6 - 23 mg/dL   Creatinine, Ser 7.82  0.50 - 1.10 mg/dL   Calcium 9.1  8.4 - 95.6 mg/dL   Total Protein 6.4  6.0 - 8.3 g/dL   Albumin 2.5 (*) 3.5 - 5.2 g/dL   AST 213 (*) 0 - 37 U/L   ALT 172 (*) 0 - 35 U/L   Alkaline Phosphatase 96  39 - 117 U/L   Total Bilirubin <0.2 (*) 0.3 - 1.2 mg/dL   GFR calc non Af Amer >90  >90 mL/min   GFR calc Af Amer >90  >90 mL/min   Anion gap 13  5 - 15  LIPASE, BLOOD   Collection Time    06/05/14  9:56 PM      Result Value Ref Range   Lipase 25  11 - 59 U/L    Patient Active Problem List  Diagnosis Date Noted  . Pre-eclampsia 06/06/2014  . Supervision of high-risk pregnancy 05/29/2014  . Previous cesarean delivery, antepartum 05/29/2014  . [facility]  . Migraine headache without aura 05/29/2014  . AMA (advanced maternal age) multigravida 35+ 05/29/2014  . Pregnancy induced hypertension, antepartum 05/29/2014    Assessment: Lonni Fixngrid Barrios Lopez is a 38 y.o. [id number] at 6332w1d here for severe pre-eclampsia.  #) Severe Pre-eclampsia - H/o PIH in pregnancy and now with severe range blood pressures (200s/110s) with neurologic visual changes and elevated LFTs. Started on Magnesium and given 1 dose IV labetalol at OSH with resolution of epigastric pain and scotoma.  - Continue magensium 2 g/hr - Repeat HELLP labs - Stat MFM US for fetal growth, BPP, UA dopplers, position - Labetalol 200 mg PO BID - Labetalol 20 mg IV prn BP > 160/100  #) FWB - Cat I - II tracing w/ variable decelerations and mod-minimal variability - Continuous fetal montioring - BMZ x 1, re-dose in 24 hrs - NICU c/s pending - MFM growth scan and BPP as above - Mag for pre-eclampsia and neuroprotection  #) PNC -  - Pt desires BTL and ? Repeat c-section (if this will allow her to get BTL more easily). If she can get BTL w/o c-section, she desires TOLAC. Has had successful VBAC x 1 - GBS pending   Elita BooneRoberts, Caroline C 06/06/2014, 1:35 AM   Pt seen and examined. Reviewed series of labs this morning, the preliminary report from the BPP and US.  Pt having worsening HELLP.  Given falling platelets and fetal status, delivery will need to occur before the second dose of betamethasone in 24 hours.  Will proceed with c/s this morning.   -cbc at 8 am to determine if regional anesthesia is possible -confirmed with blood bank that 2 units of PRBC are available, FFP in house, and platelets are on the way. -BP stable (not severe range -Pt considering BTL.  Needs to ask  husband permission and he is on the way. -MFM consulted Vincenza Hews([place]), who agrees with plan. -Needs Magnesium for 24 hours after delivery.  The risks of cesarean section discussed with the patient included but were not limited to: bleeding which may require transfusion or reoperation; infection which may require antibiotics; injury to bowel, bladder, ureters or other surrounding organs; injury to the fetus; need for additional procedures including hysterectomy in the event of a life-threatening hemorrhage; placental abnormalities wth subsequent pregnancies, incisional problems, thromboembolic phenomenon and other postoperative/anesthesia complications. The patient concurred with the proposed plan, giving informed written consent for the procedure.   Patient desires permanent sterilization.  Other reversible forms of contraception were discussed with patient; she declines all other modalities. Risks of procedure discussed with patient including but not limited to: risk of regret, permanence of method, bleeding, infection, injury to surrounding organs and need for additional procedures.  Failure risk of 0.5-1% with increased risk of ectopic gestation if pregnancy occurs was also discussed with patient.    Patient verbalized understanding of these risks and wants to proceed with sterilization.  Written informed consent obtained.  To OR when ready.

## 2014-06-06 NOTE — Addendum Note (Signed)
Addendum created 06/06/14 1724 by Orlie Pollenebra R Sanvi Ehler, CRNA   Modules edited: Notes Section   Notes Section:  File: [id number]

## 2014-06-06 NOTE — Progress Notes (Signed)
06/06/14 1302  Clinical Encounter Type  Visited With Health care provider  Consult/Referral To Nurse  Advance Directives (For Healthcare)  Does patient have an advance directive? No  Would patient like information on creating an advanced directive? No - patient declined information   [place] is aware of delivery and plans to visit for support when timing would be constructive for pt and family.  Please page as pt becomes ready or particular needs arise for pt/family:  [phone].  Thank you.  [address] Windfall RoadChaplain Natavia Sublette HoustonLundeen, [place]iv [phone]

## 2014-06-06 NOTE — Anesthesia Postprocedure Evaluation (Signed)
  Anesthesia Post-op Note  Patient: Martha Paul  Procedure(s) Performed: Procedure(s): CESAREAN SECTION (N/A)  Patient Location: PACU and A-ICU  Anesthesia Type:Spinal  Level of Consciousness: awake, alert , oriented and patient cooperative  Airway and Oxygen Therapy: Patient Spontanous Breathing  Post-op Pain: none  Post-op Assessment: Post-op Vital signs reviewed, Patient's Cardiovascular Status Stable, Respiratory Function Stable, Patent Airway, No signs of Nausea or vomiting, Adequate PO intake, Pain level controlled, No headache, No backache, No residual numbness and No residual motor weakness  Post-op Vital Signs: Reviewed and stable  Last Vitals:  Filed Vitals:   06/06/14 1700  BP: 109/71  Pulse: 89  Temp:   Resp: 18    Complications: No apparent anesthesia complications

## 2014-06-06 NOTE — Anesthesia Procedure Notes (Signed)
Spinal  Patient location during procedure: OR Start time: 06/06/2014 8:45 AM Staffing Anesthesiologist: Brayton CavesJACKSON, Yacine Garriga Performed by: anesthesiologist  Preanesthetic Checklist Completed: patient identified, site marked, surgical consent, pre-op evaluation, timeout performed, IV checked, risks and benefits discussed and monitors and equipment checked Spinal Block Patient position: sitting Prep: DuraPrep Patient monitoring: heart rate, cardiac monitor, continuous pulse ox and blood pressure Approach: midline Location: L3-4 Injection technique: single-shot Needle Needle type: Sprotte  Needle gauge: 24 G Needle length: 9 cm Assessment Sensory level: T4 Additional Notes Patient identified.  Risk benefits discussed including failed block, incomplete pain control, headache, nerve damage, paralysis, blood pressure changes, nausea, vomiting, reactions to medication both toxic or allergic, and postpartum back pain.  Patient expressed understanding and wished to proceed.  All questions were answered.  Sterile technique used throughout procedure.  CSF was clear.  No parasthesia or other complications.  Please see nursing notes for vital signs.

## 2014-06-06 NOTE — Transfer of Care (Signed)
Immediate Anesthesia Transfer of Care Note  Patient: Martha Paul  Procedure(s) Performed: Procedure(s): CESAREAN SECTION (N/A)  Patient Location: PACU  Anesthesia Type:Spinal  Level of Consciousness: awake, alert  and oriented  Airway & Oxygen Therapy: Patient Spontanous Breathing  Post-op Assessment: Report given to PACU RN and Post -op Vital signs reviewed and stable  Post vital signs: Reviewed and stable  Complications: No apparent anesthesia complications

## 2014-06-06 NOTE — Progress Notes (Signed)
I assisted Dr Brayton CavesFreeman Jackson with explanation of care plan. Eda H Royal Interpreter I assisted Dr Elsie LincolnKelly Leggett with explanation of care plan for a C Section.  Eda H Royal Interpreter.

## 2014-06-06 NOTE — Op Note (Signed)
Lonni Fix PROCEDURE DATE: 06/06/2014  PREOPERATIVE DIAGNOSES: Intrauterine pregnancy at [redacted]w[redacted]d weeks gestation; severe preeclampsia and HELLP syndrome, coagulopathy; undesired fertility  POSTOPERATIVE DIAGNOSES: The same  PROCEDURE: Repeat Cesarean Section, vertical cesarean section, Bilateral Tubal Sterilization using Filshie clips  SURGEON:  Dr. Scheryl Darter  ASSISTANT:  Dr. Fredirick Lathe  ANESTHESIOLOGIST: Dr. Jean Rosenthal  INDICATIONS: Karelyn Brisby is a 38 y.o. [id number] at [redacted]w[redacted]d here for cesarean section and bilateral tubal sterilization secondary to the indications listed under preoperative diagnoses.  Patient presented with severe range blood pressures, RUQ pain and found to have in crease AST/ALT 157/172 => 577/455, decrease in platelets 196 =>93, fibrinogen was low for GA which led me to believe patient was becoming coagulopathic.  Patient received 2 doses of betamethasone, albeit the 2nd was 12 hours after first, also received magnesium for preE and Cerebral palsy prophylaxis; please see preoperative note for further details.  The risks of surgery were discussed with the patient including but were not limited to: bleeding which may require transfusion or reoperation; infection which may require antibiotics; injury to bowel, bladder, ureters or other surrounding organs; injury to the fetus; need for additional procedures including hysterectomy in the event of a life-threatening hemorrhage; placental abnormalities wth subsequent pregnancies, incisional problems, thromboembolic phenomenon and other postoperative/anesthesia complications.  Patient also desires permanent sterilization.  Other reversible forms of contraception were discussed with patient; she declines all other modalities. Risks of procedure discussed with patient including but not limited to: risk of regret, permanence of method, bleeding, infection, injury to surrounding organs and need for additional procedures.   Failure risk of 1-2% with increased risk of ectopic gestation if pregnancy occurs was also discussed with patient.  The patient concurred with the proposed plan, giving informed written consent for the procedures.    FINDINGS:  Viable female infant in cephalic presentation.  Apgars 1 and 7.  Clear amniotic fluid.  Intact placenta, three vessel cord.  Normal uterus, fallopian tubes and ovaries bilaterally. Fallopian tubes sterilized with Filshie clips bilaterally.  ANESTHESIA: Spinal INTRAVENOUS FLUIDS: 975 ml ESTIMATED BLOOD LOSS: 300 ml URINE OUTPUT:  50 ml SPECIMENS: Placenta sent to pathology COMPLICATIONS: rectus abdominus hematoma  PROCEDURE IN DETAIL:  The patient preoperatively received intravenous antibiotics and had sequential compression devices applied to her lower extremities.   She was then taken to the operating room where spinal anesthesia was administered and was found to be adequate. She was then placed in a dorsal supine position with a leftward tilt, and prepped and draped in a sterile manner.  A foley catheter was placed into her bladder and attached to constant gravity.  After an adequate timeout was performed, a Pfannenstiel skin incision was made with scalpel above previous scar line and carried through to the underlying layer of fascia. The fascia was incised in the midline, and this incision was extended bilaterally using the Mayo scissors.  Kocher clamps were applied to the superior aspect of the fascial incision and the underlying rectus muscles were dissected off bluntly. A similar process was carried out on the inferior aspect of the fascial incision. The rectus muscles were separated in the midline bluntly and the peritoneum was entered bluntly. Attention was turned to the uterus at which time it was noted that she did not have a well developed lower uterine segment, vertical uterine incision made, incision subsequently extended with bandage scissors.  The infant was  successfully delivered by Dr. Debroah Loop, the cord was clamped and cut and the infant was  handed over to awaiting neonatology team. Uterine massage was then administered, and the placenta delivered intact with a three-vessel cord. The uterus was then cleared of clot and debris.  The hysterotomy was closed with 0 Vicryl in a running locked fashion, and an imbricating layer was also placed with 0 Vicryl, baseball stitch was used for 3rd layer of hysterotomy.  Attention was then turned to the fallopian tubes, and Filshie clips were placed about 3 cm from the cornua, with care given to incorporate the underlying mesosalpinx on both sides, allowing for bilateral tubal sterilization. The pelvis was cleared of all clot and debris. Hemostasis was confirmed on all surfaces.  The peritoneum and the muscles were reapproximated using 0 Vicryl interrupted stitches. The fascia was then closed using 0 Vicryl in a running fashion.  Left sided 3cmx2cm rectus hematoma was noted, figure of eight placed for hemostasis.  The subcutaneous layer was irrigated, then reapproximated with 2-0 plain gut interrupted stitches.  The skin was closed with a 4-0 Vicryl subcuticular stitch. The patient tolerated the procedure well. Sponge, lap, instrument and needle counts were correct x 2.  She was taken to the recovery room in stable condition.   Perry MountACOSTA,Fredna Stricker ROCIO, MD OB Fellow Faculty Practice, [facility]

## 2014-06-06 NOTE — Anesthesia Postprocedure Evaluation (Signed)
  Anesthesia Post Note  Patient: Martha Paul  Procedure(s) Performed: Procedure(s) (LRB): CESAREAN SECTION (N/A)  Anesthesia type: Spinal  Patient location: PACU  Post pain: Pain level controlled  Post assessment: Post-op Vital signs reviewed  Last Vitals:  Filed Vitals:   06/06/14 1100  BP: 123/80  Pulse: 90  Temp:   Resp: 23    Post vital signs: Reviewed  Level of consciousness: awake  Complications: No apparent anesthesia complications

## 2014-06-06 NOTE — Progress Notes (Signed)
Stopped by to check on Patient's needs. Martha Paul Interpreter.

## 2014-06-06 NOTE — Progress Notes (Signed)
I assisted Financial traderCassie RN with some registration questions. By [name], Spanish Interpreter

## 2014-06-06 NOTE — ED Notes (Addendum)
[facility]

## 2014-06-06 NOTE — Progress Notes (Signed)
UR chart review completed.  

## 2014-06-06 NOTE — Progress Notes (Signed)
External monitors removed for bedside ultrasound

## 2014-06-06 NOTE — Progress Notes (Signed)
[facility] ultrasound  Indication: 38 yr old [id number] at 2562w1d with preeclampsia with severe features/HELLP syndrome for fetal ultrasound. Remote read.  Findings: 1. Single intrauterine pregnancy. 2. Estimated fetal weight is in the 14th%; all parameters are in the <3rd%. 3. Posterior placenta without evidence of previa. 4. Oligohydramnios with maximum vertical pocket of 1.87cm. 5. Normal transabdominal cervical length. 6. The anatomy survey is limited as above by fetal position and oligohydramnios; no abnormalities were seen. 7. Abnormal biophysical profile of 2/8. 8. There is persistent absent end diastolic flow in umbilical artery Doppler studies with occasional reversal of flow.  Recommendations: 1. Fetal growth restriction: - given clinical picture this is likely due to placental insufficiency  2. Preeclampsia with severe features/ HELLP syndrome: - given compromised fetal status (nonreassuring fetal testing- BPP 2/8), oligohydramnios, and abnormal Doppler studies there is concern for signficant placental compromise and given maternal status- severe range blood pressures, elevated LFTs and thrombocytopenia I recommend delivery - patient has gotten one dose of betamethasone; recommend given 2nd dose prior to delivery (will be around 12 hours after 1st dose) - patient is already on magnesium sulfate for seizure prophylaxis- will also be used for fetal neuroprotection - recommend continue magnesium sulfate for 24 hours post partum - recommend treatment of blood pressures to maintain <150/100  Discussed the above with Dr. Penne LashLeggett who is in agreement and will proceed with delivery  Eulis FosterKristen Opel Lejeune, MD

## 2014-06-06 NOTE — Progress Notes (Signed)
I was present during the C Section with Dr Debroah LoopArnold.  Eda H Royal  Interpreter  I assisted BraddyvilleNatalie, RN with questions.  Eda H Royal Interpreter.

## 2014-06-06 NOTE — Lactation Note (Signed)
This note was copied from the chart of Martha Paul. Lactation Consultation Note     Initial consult with this mom of a NICU baby, now 2 hours old, and weighing 1 pound 1 oz, 261/7 weeks CGA. This is mom's 5th baby, and I spoke with her through EDa, [place] interpreter, briefly. Mom is not sure she wants to provide EBm for the baby. She is aware this is best for the baby, but is very tired, not feeding well, and asked that we give her time to decide. Mom is in AICU with HELLP syndrom, and s/p C-section. I told mom's nurse to call me if mom decides to pump.   Patient Name: Martha Paul Today's Date: 06/06/2014 Reason for consult: Initial assessment;NICU baby   Maternal Data Formula Feeding for Exclusion:  (baby in NICU, mom not sure she watns to provide ) Reason for exclusion: Admission to Intensive Care Unit (ICU) post-partum Has patient been taught Hand Expression?: No  Feeding    LATCH Score/Interventions                      Lactation Tools Discussed/Used     Consult Status Consult Status: Follow-up Date: 06/06/14 Follow-up type: In-patient    Alfred LevinsLee, Abdulahi Schor Anne 06/06/2014, 1:04 PM

## 2014-06-07 ENCOUNTER — Encounter (HOSPITAL_COMMUNITY): Payer: Self-pay | Admitting: Obstetrics & Gynecology

## 2014-06-07 LAB — COMPREHENSIVE METABOLIC PANEL
ALK PHOS: 68 U/L (ref 39–117)
ALT: 177 U/L — ABNORMAL HIGH (ref 0–35)
ANION GAP: 12 (ref 5–15)
AST: 80 U/L — ABNORMAL HIGH (ref 0–37)
Albumin: 1.9 g/dL — ABNORMAL LOW (ref 3.5–5.2)
BUN: 16 mg/dL (ref 6–23)
CO2: 21 mEq/L (ref 19–32)
CREATININE: 0.5 mg/dL (ref 0.50–1.10)
Calcium: 6.9 mg/dL — ABNORMAL LOW (ref 8.4–10.5)
Chloride: 101 mEq/L (ref 96–112)
GFR calc Af Amer: >90 mL/min (ref >90)
Glucose, Bld: 130 mg/dL — ABNORMAL HIGH (ref 70–99)
Potassium: 4.6 mEq/L (ref 3.7–5.3)
Sodium: 134 mEq/L — ABNORMAL LOW (ref 137–147)
Total Bilirubin: <0.2 mg/dL — ABNORMAL LOW (ref 0.3–1.2)
Total Protein: 4.8 g/dL — ABNORMAL LOW (ref 6.0–8.3)

## 2014-06-07 LAB — CBC
HCT: 29 % — ABNORMAL LOW (ref 36.0–46.0)
HCT: 28.5 % — ABNORMAL LOW (ref 36.0–46.0)
Hemoglobin: 10.1 g/dL — ABNORMAL LOW (ref 12.0–15.0)
Hemoglobin: 9.7 g/dL — ABNORMAL LOW (ref 12.0–15.0)
MCH: 31.5 pg (ref 26.0–34.0)
MCH: 31.2 pg (ref 26.0–34.0)
MCHC: 34 g/dL (ref 30.0–36.0)
MCHC: 34.8 g/dL (ref 30.0–36.0)
MCV: 90.3 fL (ref 78.0–100.0)
MCV: 91.6 fL (ref 78.0–100.0)
PLATELETS: 102 10*3/uL — AB (ref 150–400)
Platelets: 115 10*3/uL — ABNORMAL LOW (ref 150–400)
RBC: 3.21 MIL/uL — ABNORMAL LOW (ref 3.87–5.11)
RBC: 3.11 MIL/uL — ABNORMAL LOW (ref 3.87–5.11)
RDW: 13.7 % (ref 11.5–15.5)
RDW: 13.9 % (ref 11.5–15.5)
WBC: 17.6 10*3/uL — ABNORMAL HIGH (ref 4.0–10.5)
WBC: 18.8 10*3/uL — AB (ref 4.0–10.5)

## 2014-06-07 NOTE — Progress Notes (Signed)
Stopped by to order lunch and check on patient's needs. Eda H Royal Interpreter.

## 2014-06-07 NOTE — Progress Notes (Signed)
Pt seen with Dr. Thornell SartoriusWohlert and Dr. Loreta AveAcosta.  Magnesium stopped. Attestation of Attending Supervision of Fellow: Evaluation and management procedures were performed by the Fellow under my supervision and collaboration.  I have reviewed the Fellow's note and chart, and I agree with the management and plan.

## 2014-06-07 NOTE — Lactation Note (Signed)
This note was copied from the chart of Martha Paul. Lactation Consultation Note  Follow up visit made.  Mom states she pumped yesterday but not today.  She agrees to pumping now. Assisted with pumping and reviewed preterm setting, pumping schedule and cleaning of pump parts.  [facility] and [facility]ed to office.  Explained supply and demand and milk coming to volume between the 3-5 day after birth.  Mom states she breastfed previous babies.  Patient Name: Martha Paul Today's Date: 06/07/2014     Maternal Data    Feeding    LATCH Score/Interventions                      Lactation Tools Discussed/Used     Consult Status      Huston FoleyMOULDEN, Lorence Nagengast S 06/07/2014, 11:56 AM

## 2014-06-07 NOTE — Progress Notes (Signed)
I stopped by to check on Patien't needs. I ordered her breakfast. I assisted Research officer, political partyBrenda RN with some questions and explanation about breast feeding and how to use breast pump. By [name], Interpreter.

## 2014-06-07 NOTE — Progress Notes (Signed)
Post Partum Day 1 Subjective:  Martha Paul is a 38 y.o. [id number] 9823w1d POD1 primary classical c/s for preeclampsia/HELLP and BTL.  No acute events overnight. Denies headache or vision change but does endorse intermittent right upper quadrant "colicky" pain. Not drowsy, no difficulty breathing. Vaginal bleeding is mild and decreasing. Foley catheter in place.  Plan for birth control is undecided.  Method of Feeding: breast  Objective: Blood pressure 118/68, pulse 89, temperature 97.5 F (36.4 C), temperature source Oral, resp. rate 16, height 4\' 11"  (1.499 m), weight 86.909 kg (191 lb 9.6 oz), SpO2 98.00%, unknown if currently breastfeeding.  Physical Exam:  General: alert, cooperative and no distress Lochia:normal flow Chest: CTAB Heart: RRR no m/r/g Abdomen: +BS, soft, mild ttp lower abdomen Uterine Fundus: firm Indcision: c/d/i DVT Evaluation: No evidence of DVT seen on physical exam. Extremities: trace ankle edema Neuro: 2+ patellar reflexes  Results for orders placed during the hospital encounter of 06/05/14 (from the past 24 hour(s))  CBC     Status: Abnormal   Collection Time    06/06/14  1:50 PM      Result Value Ref Range   WBC 19.3 (*) 4.0 - 10.5 K/uL   RBC 3.87  3.87 - 5.11 MIL/uL   Hemoglobin 12.1  12.0 - 15.0 g/dL   HCT 45.434.3 (*) 09.836.0 - 11.946.0 %   MCV 88.6  78.0 - 100.0 fL   MCH 31.3  26.0 - 34.0 pg   MCHC 35.3  30.0 - 36.0 g/dL   RDW 14.713.5  82.911.5 - 56.215.5 %   Platelets 107 (*) 150 - 400 K/uL  CBC     Status: Abnormal   Collection Time    06/07/14 12:59 AM      Result Value Ref Range   WBC 17.6 (*) 4.0 - 10.5 K/uL   RBC 3.21 (*) 3.87 - 5.11 MIL/uL   Hemoglobin 10.1 (*) 12.0 - 15.0 g/dL   HCT 13.029.0 (*) 86.536.0 - 78.446.0 %   MCV 90.3  78.0 - 100.0 fL   MCH 31.5  26.0 - 34.0 pg   MCHC 34.8  30.0 - 36.0 g/dL   RDW 69.613.7  29.511.5 - 28.415.5 %   Platelets 102 (*) 150 - 400 K/uL  COMPREHENSIVE METABOLIC PANEL     Status: Abnormal   Collection Time    06/07/14  5:15 AM   Result Value Ref Range   Sodium 134 (*) 137 - 147 mEq/L   Potassium 4.6  3.7 - 5.3 mEq/L   Chloride 101  96 - 112 mEq/L   CO2 21  19 - 32 mEq/L   Glucose, Bld 130 (*) 70 - 99 mg/dL   BUN 16  6 - 23 mg/dL   Creatinine, Ser 1.320.50  0.50 - 1.10 mg/dL   Calcium 6.9 (*) 8.4 - 10.5 mg/dL   Total Protein 4.8 (*) 6.0 - 8.3 g/dL   Albumin 1.9 (*) 3.5 - 5.2 g/dL   AST 80 (*) 0 - 37 U/L   ALT 177 (*) 0 - 35 U/L   Alkaline Phosphatase 68  39 - 117 U/L   Total Bilirubin <0.2 (*) 0.3 - 1.2 mg/dL   GFR calc non Af Amer >90  >90 mL/min   GFR calc Af Amer >90  >90 mL/min   Anion gap 12  5 - 15    Assessment/Plan:  ASSESSMENT: Martha Paul is a 38 y.o. G4W1027G6P4115 423w1d s/p primary classical c/s and BTL for preeclampsia/HELLP. Overnight BP wnl  not requiring meds. HELLP labs today improved: creatinine wnl, LFTs downtrending, platelets stable at 102. No bleeding/oozing from incision site. Hemoglobin 10.1 from pre-surgery 13.7. Headache and visual symptoms resolved; does have continued RUQ pain but it is much improved. Received betamethasone x2 but 12 hours apart. UOP is good, 1750 previous 24 and 600 in the past 5 hours. - discontinue magnesium, will have received 24 hours - continue to monitor BP - AM HELLP labs - transfer to floor - breastfeeding, undecided regarding contraception   LOS: 2 days   [facility], Keigo Whalley 06/07/2014, 11:47 AM

## 2014-06-07 NOTE — Progress Notes (Signed)
Stopped by to check on patient's needs. Eda H Royal Interpreter. °

## 2014-06-08 LAB — COMPREHENSIVE METABOLIC PANEL
ALK PHOS: 66 U/L (ref 39–117)
ALT: 107 U/L — AB (ref 0–35)
AST: 31 U/L (ref 0–37)
Albumin: 2 g/dL — ABNORMAL LOW (ref 3.5–5.2)
Anion gap: 7 (ref 5–15)
BUN: 16 mg/dL (ref 6–23)
CALCIUM: 7.4 mg/dL — AB (ref 8.4–10.5)
CHLORIDE: 107 meq/L (ref 96–112)
CO2: 25 meq/L (ref 19–32)
Creatinine, Ser: 0.48 mg/dL — ABNORMAL LOW (ref 0.50–1.10)
GFR calc Af Amer: >90 mL/min (ref >90)
Glucose, Bld: 93 mg/dL (ref 70–99)
POTASSIUM: 4.4 meq/L (ref 3.7–5.3)
SODIUM: 139 meq/L (ref 137–147)
Total Bilirubin: <0.2 mg/dL — ABNORMAL LOW (ref 0.3–1.2)
Total Protein: 4.8 g/dL — ABNORMAL LOW (ref 6.0–8.3)

## 2014-06-08 LAB — CBC
HCT: 27.3 % — ABNORMAL LOW (ref 36.0–46.0)
HEMOGLOBIN: 9.2 g/dL — AB (ref 12.0–15.0)
MCH: 31.2 pg (ref 26.0–34.0)
MCHC: 33.7 g/dL (ref 30.0–36.0)
MCV: 92.5 fL (ref 78.0–100.0)
PLATELETS: 111 10*3/uL — AB (ref 150–400)
RBC: 2.95 MIL/uL — AB (ref 3.87–5.11)
RDW: 13.9 % (ref 11.5–15.5)
WBC: 16.7 10*3/uL — AB (ref 4.0–10.5)

## 2014-06-08 MED ORDER — LABETALOL HCL 200 MG PO TABS
200.0000 mg | ORAL_TABLET | Freq: Two times a day (BID) | ORAL | Status: DC
Start: 2014-06-08 — End: 2014-06-09
  Administered 2014-06-08 – 2014-06-09 (×3): 200 mg via ORAL
  Filled 2014-06-08 (×3): qty 1

## 2014-06-08 NOTE — Lactation Note (Signed)
This note was copied from the chart of Martha Paul. Lactation Consultation Note  Mom is pumping and obtaining 5-10 mls of transitional milk.  She knows to pump every 3 hours for 15 minutes.  [facility] has called mom and will have a pump loaner for her tomorrow.  Encouraged to call with concerns prn.  Patient Name: Martha Paul Today's Date: 06/08/2014     Maternal Data    Feeding    LATCH Score/Interventions                      Lactation Tools Discussed/Used     Consult Status      Huston FoleyMOULDEN, Kenzo Ozment S 06/08/2014, 1:03 PM

## 2014-06-08 NOTE — Progress Notes (Signed)
Stopped by to check on patient's needs. Eda H Royal Interpreter. °

## 2014-06-08 NOTE — Progress Notes (Signed)
I stopped by patients to check her needs. By [name] , Interpreter

## 2014-06-08 NOTE — Progress Notes (Signed)
Assisted Thayer Ohmhris, RN with questions. Eda H Royal  Interpreter.

## 2014-06-08 NOTE — Progress Notes (Signed)
Subjective: Postpartum Day 2: Cesarean Delivery Patient reports tolerating PO, + flatus and no problems voiding.  She denies abd pain, baby doing okay  Objective: Vital signs in last 24 hours: Temp:  [97.5 F (36.4 C)-99 F (37.2 C)] 98.4 F (36.9 C) (10/08 0533) Pulse Rate:  [84-102] 85 (10/08 0533) Resp:  [16-18] 16 (10/08 0533) BP: (107-155)/(67-92) 145/92 mmHg (10/08 0533) SpO2:  [95 %-100 %] 100 % (10/08 0533) Weight:  [81.874 kg (180 lb 8 oz)] 81.874 kg (180 lb 8 oz) (10/08 0540)  Physical Exam:  General: alert, cooperative and no distress Lochia: appropriate Uterine Fundus: small , difficult to feel, dressing from c/s in place Incision: no significant drainage DVT Evaluation: No evidence of DVT seen on physical exam. Reflexes 1-2+ no clonus  Recent Labs  06/07/14 1310 06/08/14 0530  HGB 9.7* 9.2*  HCT 28.5* 27.3*    Assessment/Plan: Status post Cesarean section. Postoperative course complicated by pesisitent mild HTN  Add labetalol 200 bid, anticipate d/c in a.m. 10/9.  Zylan Almquist V 06/08/2014, 6:58 AM

## 2014-06-09 LAB — TYPE AND SCREEN
ABO/RH(D): O POS
Antibody Screen: NEGATIVE
UNIT DIVISION: 0
Unit division: 0

## 2014-06-09 MED ORDER — LABETALOL HCL 200 MG PO TABS: 200.0000 mg | ORAL_TABLET | Freq: Two times a day (BID) | ORAL | Status: DC

## 2014-06-09 MED ORDER — IBUPROFEN 600 MG PO TABS: 600.0000 mg | ORAL_TABLET | Freq: Four times a day (QID) | ORAL | Status: DC

## 2014-06-09 MED ORDER — OXYCODONE-ACETAMINOPHEN 5-325 MG PO TABS: 1.0000 | ORAL_TABLET | ORAL | Status: DC | PRN

## 2014-06-09 NOTE — Progress Notes (Signed)
Stopped by to check on patient's needs. Eda H Royal Interpreter. °

## 2014-06-09 NOTE — Discharge Instructions (Signed)
Hipertensin durante el embarazo (Hypertension During Pregnancy) Cuando se sufre hipertensin arterial o presin arterial alta, existe una presin extra en el interior de los vasos sanguneos que llevan la sangre desde el corazn al resto del cuerpo (arterias). Esto puede suceder en cualquier etapa de la vida, incluido el embarazo. La hipertensin durante el embarazo puede causar problemas para usted y el beb. Es posible que el beb no tenga el peso adecuado al nacer o puede que nazca antes de tiempo (prematuro). En los casos muy graves de hipertensin durante el embarazo puede estar en peligro la vida.  Durante el embarazo se pueden presentar diferentes tipos de hipertensin arterial. Estos incluyen:  Hipertensin crnica. Esto sucede cuando una mujer sufre de hipertensin arterial antes del Media planner y Sports coach.  Hipertensin gestacional. Es cuando se desarrolla la hipertensin Solicitor.  Preeclampsia o toxemia del embarazo. Es un tipo muy grave de hipertensin que se desarrolla solo durante el [place]. Afecta a todo el cuerpo y puede ser muy peligrosa para la madre y el beb. La hipertensin gestacional y preeclampsia por lo general, desaparecen despus del nacimiento del beb. La presin arterial probablemente se estabilizar en un perodo de 6 semanas. Las mujeres que sufren de hipertensin durante el embarazo tienen una mayor probabilidad de Actor hipertensin en etapas posteriores de la vida o en embarazos futuros. FACTORES DE RIESGO Existen ciertos factores que aumentan las probabilidades de que desarrolle hipertensin durante el [place]. Estos incluyen:  Tener hipertensin arterial antes del embarazo.  Haber sufrido hipertensin arterial durante un embarazo anterior.  Tener sobrepeso.  Ser mayor de 40 aos.  Estar embarazada de ms de un beb.  Tener diabetes o problemas renales. [place] hipertensin arterial gestacional y crnica en  raras ocasiones provoca sntomas. La preeclampsia causa sntomas, que pueden ser:  Aumento de las protenas en la orina. El Management consultant en cada visita prenatal.  [place] y la cara.  Aumento rpido de [place].  Dolores de [place].  Cambios en la visin.  Molestias al ver la luz.  Dolor abdominal, especialmente en el rea superior derecha.  Dolor en el pecho.  Falta de aire.  Aumento de los reflejos.  Convulsiones. Las convulsiones ocurren en una forma ms grave de preeclampsia, llamada eclampsia. DIAGNSTICO  Es posible que se le diagnostique hipertensin arterial durante un control prenatal regular. En cada visita prenatal, es posible que le realicen los siguientes exmenes:  Control de la presin arterial.  Anlisis de orina para detectar protenas en la orina. El tipo de hipertensin que se diagnostica depende del momento en que se desarroll. Tambin depende de la lectura de su presin arterial especfica.  El desarrollo de hipertensin arterial antes de lasemana 20 de embarazo es consecuente con la hipertensin arterial crnica.  El desarrollo de hipertensin arterial despus de la semana 20 de embarazo es consecuente con la hipertensin gestacional.  Hipertensin con aumento de la protena urinaria se diagnostica como preeclampsia.  Las mediciones de la presin arterial de ms de 0000000 sistlica o [id number] diastlica son un signo de preeclampsia grave. TRATAMIENTO El tratamiento para la hipertensin durante el embarazo vara. Depende del tipo de hipertensin y de su gravedad.  Si toma medicamentos para la hipertensin crnica, puede que tenga que cambiarlos.  Los medicamentos llamados inhibidores de la ECA no deben tomarse Solicitor.  Para las mujeres que tienen factores de riesgo de preeclampsia pueden recomendarse bajas dosis de aspirina.  Si usted tiene  hipertensin gestacional, tendr que tomar un medicamento para la presin arterial que  sea seguro durante el embarazo. El Market researchermdico le recomendar el medicamento apropiado.  Si tiene preeclampsia grave, es posible que tenga que Engineer, maintenancepermanecer en el hospital. Los mdicos la controlarn a usted y al beb muy de cerca. Probablemente deba tomar un medicamento denominado sulfato de magnesio para prevenir las convulsiones y reducir la presin arterial.  A veces es necesario inducir un parto prematuro. Por ejemplo, si la afeccin empeora. Se hace para protegerlos a usted y a su beb. La nica cura para la preeclampsia es el parto.  Su mdico puede recomendarle que tome una aspirina de dosis baja (81mg ) cada da, a fin de ayudar a prevenir la hipertensin durante el embarazo, si est en riesgo de padecer preeclampsia. Puede estar en riesgo de padecer preeclampsia si:  Padeci preeclampsia o eclampsia durante un embarazo anterior.  Su beb no creci segn lo previsto durante un embarazo anterior.  Tuvo un parto prematuro en un embarazo anterior.  Experiment una separacin de la placenta desde el tero (desprendimiento abrupto de la placenta) durante un embarazo anterior.  Perdi un beb en un embarazo anterior.  Est embarazada de ms de un beb.  Padece otras afecciones mdicas, como diabetes o una enfermedad autoinmunitaria. INSTRUCCIONES PARA EL CUIDADO EN EL HOGAR  Programe y concurra a todas las citas de control prenatal regulares. Esto es importante.  Tome los medicamentos solamente como se lo haya indicado el mdico. Dgale a su mdico todos los medicamentos que toma.  Consuma la menor cantidad posible de sal.  Realice actividad fsica con regularidad.  No beba alcohol.  No fume ni use productos que contengan tabaco.  No beba productos con cafena.  Acustese sobre el lado izquierdo cuando haga reposo. SOLICITE ATENCIN MDICA DE INMEDIATO SI:  Siente un dolor abdominal intenso.  Presenta hinchazn repentina en las manos, los tobillos o el rostro.  Aumenta ms de 4  libras (1,8 kg) en una semana.  Vomita repetidas veces.  Tiene una hemorragia vaginal abundante.  No siente que el beb se mueva mucho.  Tiene dolores de Turkmenistancabeza.  Tiene visin doble o borrosa.  Tiene calambres o espasmos musculares.  Le falta el aire.  Tiene los labios y las uas de los dedos de las manos de Edison Internationalcolor azul.  Observa sangre en la orina. ASEGRESE DE QUE:  Comprende estas instrucciones.  Controlar su afeccin.  Recibir ayuda de inmediato si no mejora o si empeora. Document Released: 08/07/2011 Document Revised: 01/02/2014 [facility] Patient Information 2015 [place], [place]. This information is not intended to replace advice given to you by your health care provider. Make sure you discuss any questions you have with your health care provider.  Antes de que el beb llegue a casa (Before Hewlett-PackardBaby Comes Home) Estas son algunas cosas que usted debe tener antes que su beb llegue a casa. Pregunte nuevamente si no comprende. Pregunte cundo debe ver al mdico nuevamente.  Asiento infantil para el auto (exigido por ley).  Camas separadas.  Si no tiene una cama para el beb o [place]una cuna, puede hacerla con un cajn de Neomia Dearuna cmoda, una caja, cartn, cesta para la ropa, etc.  No deje que el beb duerma en una cama con usted u otra persona. Insumos para la alimentacin infantil:  6 a 8 botellas (8 onzas).  6 a 8 tetinas.  Jarra medidora.  Cuchara medidora.  Cepillo para limpiar botellas.  Corporate treasurersterilizador (o use cualquier olla o sartn grande con  tapa).  Leche maternizada que contiene hierro.  Elementos para hervir y Financial trader. Insumos de Tour manager.  Sacaleche.  Crema para los pezones. Ropa:  24 a 36 paales y cubrepaales de plstico y Neomia Dear caja de paales desechables. Usted puede necesitar tanto como 10 a 12 paales por Futures trader.  3 camisas (otras prendas depender de la poca del ao y el clima).  3 mantillas.  3 pijamas o camisn de beb .  3  baberos. Equipamiento de bao:  Jabn suave.  Vaselina. No use aceite o talco para el beb .  Toalla de tela suave y pao para lavarlo.  Pompones de algodn.  Fuente de bao especial para el beb. Slo bae al beb con Neomia Dear esponja hasta que el cordn umbilical y la circuncisin hayan curado. Otros suministros:  Un termmetro y [place] pera de goma que sern entregados para que lleve a su casa cuando el beb salga del hospital. Pregunte al mdico cmo debe tomar la temperatura del beb.  1  2 chupetes. Preprese para una emergencia:   Sepa cmo llegar al hospital y sepa en qu lugar admiten al beb.  Rena todos los nmeros telefnicos de los proveedores cerca del telfono de la casa y en su celular, si tiene [place]. Prepare a su familia:   Hable con sus hijos acerca del nuevo beb y pregunte qu piensan al respecto.  Decida cmo desea manejar las visitas y a los otros miembros de la familia.  Acepte ofertas de ayuda con el beb. Necesitar tiempo para adaptarse. Sepa cundo debe llamar al mdico:  SOLICITE AYUDA DE INMEDIATO SI:   Presenta una temperatura mayor a 100.4 F (38 C).  Abultamiento de las fontanelas en la cabeza.  Sntomas de deshidratacin como llanto sin lgrimas o ausencia de paales mojados luego de 6 horas.  Respiracin rpida.  Disminuye el estado de [employer]. Document Released: 11/14/2008 Document Revised: 11/10/2011 [facility] Patient Information 2015 [place], [place]. This information is not intended to replace advice given to you by your health care provider. Make sure you discuss any questions you have with your health care provider.  Consejos para la extraccin de la leche materna con un sacaleche (Breast Pumping Tips) Si est amamantando, tal vez haya momentos en los que no pueda alimentar directamente al beb, por ejemplo, esto ocurrir comnmente cuando regrese a trabajar o est de viaje. La extraccin con un sacaleche le permite guardar la leche materna  y alimentar al beb ms tarde.  Cuando empiece con las extracciones, es posible que no tenga mucha cantidad de [place]. Las ConAgra Foods deben comenzar a producir ms despus de Time Warner. Si se extrae leche materna con un sacaleche en los horarios en los que suele alimentar al beb, es posible que pueda seguir produciendo la cantidad de [place] suficiente para alimentarlo, sin tener que darle tambin [place] maternizada. Cuanto ms a menudo realice las extracciones, ms Hilton Hotels.  CUNDO DEBO EXTRAERME LECHE MATERNA CON UN SACALECHE?   Puede comenzar con las extracciones inmediatamente despus del parto. Sin embargo, algunos expertos recomiendan esperar durante aproximadamente 4semanas antes de darle al beb un bibern, a fin de asegurarse de que la lactancia sea satisfactoria.  Si tiene planes de regresar a trabajar, comience con las extracciones unas semanas antes. Esto la ayudar a Environmental education officer las tcnicas que mejor funcionen para usted. Tambin le permite acumular un suministro de [place].  Cuando est con el beb, alimntelo cuando el pequeo se lo pida, y extrigase [address]  despus de cada sesin de alimentacin.  Cuando no est con el beb durante varias horas, extrigase [employer] unos , cada 2 o 3horas. Extraiga de ambos senos al mismo tiempo si es posible.  Si alimenta al beb con [employer], realice las extracciones aproximadamente a la misma hora.  Si bebe alcohol, espere 2horas antes de Public relations account executive. CMO ME PREPARO PARA LA EXTRACCIN DE LA LECHE MATERNA CON UN SACALECHE? El reflejo de bajada es la reaccin natural a la estimulacin que produce el flujo de [place]. Es ms fcil estimular este reflejo cuando est relajada. Busque las tcnicas de relajacin que sean adecuadas para usted. Si tiene dificultades con el reflejo de [place], pruebe estos mtodos:   Huela una de las [place] o una prenda del beb.  Mire  una fotografa o un video del beb.  Sintese en un lugar que sea tranquilo y donde tenga privacidad.  Hgase masajes en la mama de la que extraer la [place].  Aplquese calor relajante en la mama.  Ponga msica que la relaje. CULES SON ALGUNOS DE LOS CONSEJOS GENERALES PARA EXTRAER LA LECHE MATERNA CON UN SACALECHE?  Lvese las manos antes de la extraccin. No es necesario que se lave los pezones ni las [place].  Hay tres formas de Public relations account executive.  Puede masajearse y comprimir la mama con las manos.  Puede usar un sacaleche porttil manual.  Puede usar un Counsellor.  Asegrese de que la ventosa de succin (brida) del sacaleche sea del tamao correcto. Coloque la brida directamente sobre el pezn. Si no es del Tour manager o est mal colocada, puede provocarle dolor y Civil Service fast streamer.  Si la extraccin es incmoda, aplique una pequea cantidad de lanolina purificada o modificada en el pezn y la areola.  Si utiliza un Counsellor, ajuste la velocidad y la potencia de succin para que sean ms cmodas.  Si la extraccin es dolorosa o siente que no obtiene buena cantidad de [address], es posible que necesite otro tipo de Midwife. Un asesor en lactancia puede ayudarla a determinar el tipo de sacaleche a Chemical engineer.  En todo momento, tenga cerca una botella llena de agua. Beber abundante lquido la ayuda a producir ms [employer].  Puede guardar la leche para usarla ms tarde. La leche materna que se extrajo con un sacaleche puede guardarse en un recipiente o una bolsa de plstico estril con cierre hermtico. Etiquete toda la leche materna que guarda con la fecha de la extraccin.  La leche materna puede dejarse a temperatura ambiente durante 8horas como mximo.  Puede guardar la [employer] materna en el refrigerador durante 8das como mximo.  Puede guardar le [employer] Resources materna en el congelador durante . Para descongelar la Express Scripts, use  agua tibia. No la ponga en el microondas.  No fumar. Fumar puede reducir la produccin de [place] y daar al beb. Si necesita ayuda para dejar de fumar, pdale al mdico que le recomiende un programa. CUNDO DEBO LLAMAR AL MDICO O A UN ASESOR EN LACTANCIA?  Tiene problemas para extraer la leche materna con el sacaleche.  Est preocupada porque no produce la cantidad suficiente de leche.  Siente dolor o enrojecimiento en los pezones.  Desea usar anticonceptivos. Las pldoras anticonceptivas pueden disminuir su suministro de [place]. Converse con el mdico acerca de sus opciones. Document Released: 02/17/2012 Document Revised: 08/23/2013 [facility] Patient Information 2015 [place], [place]. This information is not intended to replace advice given to you by your health  care provider. Make sure you discuss any questions you have with your health care provider. Cuidados en el postparto luego de un parto por cesrea  (Postpartum Care After Cesarean Delivery) Despus del parto (perodo de postparto), la estada normal en el hospital es de 24-72 horas. Si hubo problemas con el trabajo de parto o el parto, o si tiene otros problemas mdicos, es posible que Hydrologistdeba permanecer en el hospital por ms Hazardtiempo.  Mientras est en el hospital, recibir [place] and the Grenadinesayuda e instrucciones sobre cmo cuidar de usted misma y de su beb recin nacido durante el postparto.  Mientras est en el hospital:   Es normal que sienta dolor o molestias en la incisin en el abdomen. Asegrese de decirle a las enfermeras si Electronics engineersiente dolor, as como donde Medical laboratory scientific officersiente el dolor y Training and development officerqu empeora el dolor.  Si est amamantando, puede sentir contracciones dolorosas en el tero durante algunas semanas. Esto es normal. Las contracciones ayudan a que el tero vuelva a su tamao normal.  Es normal tener algo de sangrado despus del [place]parto.  Durante los primeros 1-3 das despus del parto, el flujo es de color rojo y la cantidad puede ser similar a un  perodo.  Es frecuente que el flujo se inicie y se Chief Strategy Officerdetenga.  En los primeros [place]as, puede eliminar algunos cogulos pequeos. Informe a las enfermeras si comienza a eliminar cogulos grandes o aumenta el flujo.  No  elimine los cogulos de sangre por el inodoro antes de que la Johnson Controlsenfermera los vea.  Durante los prximos 3 a [address]10 das despus del parto, el flujo debe ser ms acuoso y rosado o Child psychotherapistmarrn.  Jake Churche diez a catorce American International Groupdas despus del parto, el flujo debe ser una pequea cantidad de secrecin de color blanco amarillento.  La cantidad de flujo disminuir en las primeras semanas despus del parto. El flujo puede detenerse en 6-8 semanas. La mayora de las mujeres no tienen ms flujo a las 12 semanas despus del [place] Pitt[place]parto.  Usted debe cambiar sus apsitos con frecuencia.  Lvese bien las manos con agua y jabn durante al menos 20 segundos despus de cambiar el apsito, usar el bao o antes de Occupational psychologistsostener o Corporate treasureralimentar a su recin nacido.  Se le quitar la va intravenosa (IV) cuando ya est bebiendo suficientes lquidos.  El tubo de drenaje para la orina (catter urinario) que se inserta antes del parto puede ser retirado Express Scriptsluego de 6-8 horas despus del parto o cuando las piernas vuelvan a Warehouse managertener sensibilidad. Usted puede sentir que tiene que vaciar la vejiga durante las primeras 6-8 horas despus de que le quiten el Licensed conveyancercatter.  Si se siente dbil, mareada o se desmaya, llame a su enfermera antes de levantarse de la cama por primera vez y antes de tomar una ducha por primera vez.  En los primeros [address] despus del parto, podr sentir las mamas sensibles y Pomariallenas. Esto se llama congestin. La sensibilidad en los senos por lo general desaparece dentro de las 48-72 horas despus de que ocurre la congestin. Tambin puede notar que la Summitleche se escapa de sus senos. Si no est amamantando no estimule sus pechos. La estimulacin de las mamas hace que sus senos produzcan ms Bear Creek Villageleche.  Pasar tanto tiempo como sea posible  con el beb recin nacido es muy importante. Durante este [place]ag[place]atiempo, usted y su beb deben sentirse cerca y conocerse uno al otro. Tener al beb en su habitacin (alojamiento conjunto) ayudar a fortalecer el vnculo con el beb recin nacido. Esto le dar Linwoodtiempo  para conocerlo y atenderlo de [place] cmoda.  Las hormonas se modifican despus del parto. A veces, los cambios hormonales pueden causar tristeza o ganas de llorar por un tiempo. Estos sentimientos no deben durar ms de [employer]. Si duran ms que eso, debe hablar con su mdico.  Si lo desea, hable con su mdico acerca de los mtodos de planificacin familiar o mtodos anticonceptivos.  Hable con su mdico acerca de las vacunas. El mdico puede indicarle que se aplique las siguientes vacunas antes de salir del hospital:  [place] contra el ttanos, la difteria y la tos ferina (Tdap) o el ttanos y la difteria (Td). Es muy importante que usted y su familia (incluyendo a los abuelos) u otras personas que cuidan al recin nacido estn al da con las vacunas Tdap o Td. Las vacunas Tdap o Td pueden ayudar a proteger al recin nacido de enfermedades.  Inmunizacin contra la rubola.  Inmunizacin contra la varicela.  Inmunizacin contra la gripe. Usted debe recibir esta vacunacin anual si no la ha recibido Academic librarian. Document Released: 08/04/2012 [facility] Patient Information 2015 [place], [place]. This information is not intended to replace advice given to you by your health care provider. Make sure you discuss any questions you have with your health care provider.

## 2014-06-09 NOTE — Progress Notes (Signed)
I assisted Julie, RN with discharge instructions. Eda H Royal Interpreter. °

## 2014-06-09 NOTE — Progress Notes (Signed)
Pt discharged to home with husband.  Condition stable.  Discharge instructions reviewed with patient and husband with help of E. Royal, Spanish language interpreter.  Pt and husband both had questions regarding pt's care at home.  All questions answered.    [facility] on Fri, Oct 23 at 8:00am.    [facility].    Location of pt's preferred pharmacy incorrect in computer.  RN called preferred pharmacy, [facility] [address] [place] and pharmacist there said they [facility] [place].  Preferred pharmacy changed in pt's record.  Pt to car via wheelchair with Dolphus Jenny. Riley, NT.

## 2014-06-09 NOTE — Progress Notes (Signed)
I assisted Dr. Harraway-Smith with discharge instructions. Martha Paul  Interpreter. °

## 2014-06-09 NOTE — Progress Notes (Signed)
I stopped by patients room , patient was asleep I was told by [name] RN the patient was ok By [name] , .

## 2014-06-09 NOTE — Discharge Summary (Signed)
Obstetric Discharge Summary Reason for Admission: cesarean section, observation/evaluation and pre-eclampsia Prenatal Procedures: Preeclampsia Intrapartum Procedures: cesarean: low cervical, vertical and tubal ligation Postpartum Procedures: none Complications-Operative and Postpartum: none Hemoglobin  Date Value Ref Range Status  06/08/2014 9.2* 12.0 - 15.0 g/dL Final     HCT  Date Value Ref Range Status  06/08/2014 27.3* 36.0 - 46.0 % Final    Physical Exam:  General: alert, cooperative, appears stated age and no distress Lochia: appropriate Uterine Fundus: firm Incision: healing well, no significant drainage, no significant erythema DVT Evaluation: No evidence of DVT seen on physical exam. No cords or calf tenderness. No significant calf/ankle edema.  Discharge Diagnoses: Premature labor and Preelampsia  Discharge Information: Date: 06/09/2014 Activity: pelvic rest Diet: routine Medications: Ibuprofen and Percocet Condition: stable and improved Instructions: refer to practice specific booklet Discharge to: home Follow-up Information   [facility] [place] Call in 6 weeks. (post partum follow up)    Contact information:   [address]  [place] [id number] [phone]      Newborn Data: Live born female  Birth Weight: 1 lb 2.3 oz (520 g) APGAR: 1, 7  Newborn in NICU  Rosemary HolmsHaller, Rayvn Rickerson 06/09/2014, 8:58 AM

## 2014-06-12 NOTE — Discharge Summary (Signed)
Pt seen and examined by me.  Agree with above student note. She has not complaints.  She is tolerating a reg diet and is voiding without difficulty. She is also passing flatus.  Ruthmary Occhipinti L. Harraway-Smith, M.D., Evern CoreFACOG

## 2014-06-23 ENCOUNTER — Ambulatory Visit (INDEPENDENT_AMBULATORY_CARE_PROVIDER_SITE_OTHER): Payer: Medicaid Other | Admitting: Family Medicine

## 2014-06-23 VITALS — BP 116/80 | HR 88 | Temp 98.5°F

## 2014-06-23 DIAGNOSIS — S301XXA Contusion of abdominal wall, initial encounter: Secondary | ICD-10-CM

## 2014-06-23 DIAGNOSIS — S2020XA Contusion of thorax, unspecified, initial encounter: Secondary | ICD-10-CM

## 2014-06-23 DIAGNOSIS — O139 Gestational [pregnancy-induced] hypertension without significant proteinuria, unspecified trimester: Secondary | ICD-10-CM

## 2014-06-23 NOTE — Progress Notes (Signed)
Pt has question concerning bruise that came up on abdomen 5 days ago.  Interpreter  Alviris Almonte Present for encounter/

## 2014-06-23 NOTE — Progress Notes (Signed)
   Subjective:    Patient ID: Martha Paul, female    DOB: 11/23/1975, 38 y.o.   MRN: [id number]  HPI Patient seen for bruise that appeared on abdomen above her incision.  The bruise appeared about 5 days ago.  No tenderness.  Abdomen tender when sits up.  Bruise appears that it is going away.  No leaking fluid from the incision.  Having some headaches.  Denies palpitations.    Review of Systems  Constitutional: Negative for fever and chills.  Gastrointestinal: Negative for nausea, vomiting, abdominal pain, diarrhea and constipation.  Genitourinary: Negative for dysuria.       Objective:   Physical Exam  Constitutional: She appears well-developed and well-nourished.  Cardiovascular: Normal rate, regular rhythm and normal heart sounds.   Pulmonary/Chest: Effort normal and breath sounds normal.  Abdominal: Soft. She exhibits no mass. There is tenderness (around incision). There is no guarding.  Incision clean, dry, intact.       Assessment & Plan:   Problem List Items Addressed This Visit   Pregnancy induced hypertension, antepartum    Other Visit Diagnoses   Bruise, trunk, initial encounter    -  Primary      Continue Labetalol. Bruising normal, appears improving.  F/u as needed.

## 2014-06-26 ENCOUNTER — Encounter: Payer: Self-pay | Admitting: *Deleted

## 2014-06-29 ENCOUNTER — Encounter: Payer: Medicaid Other | Admitting: Obstetrics & Gynecology

## 2014-07-03 ENCOUNTER — Encounter (HOSPITAL_COMMUNITY): Payer: Self-pay | Admitting: Obstetrics & Gynecology

## 2014-07-04 ENCOUNTER — Ambulatory Visit: Payer: Self-pay

## 2014-07-04 NOTE — Lactation Note (Signed)
This note was copied from the chart of Martha Paul. Lactation Consultation Note  Visit made with mom at infant's bedside.  Mom states she is pumping every 3 hours.  She is concerned because she is obtaining more first pumping in the AM and less as day goes on.  Volume expressed varies from 20-60 mls.  Encouraged to continue pumping every 2-3 hours during the day and once during the night, relax when pumping and drink plenty of fluids.  Encouraged to call with concerns prn.  Patient Name: Martha Paul Today's Date: 07/04/2014     Maternal Data    Feeding Feeding Type: Breast Milk Length of feed: 30 min  LATCH Score/Interventions                      Lactation Tools Discussed/Used     Consult Status      Huston FoleyMOULDEN, Puneet Masoner S 07/04/2014, 6:35 PM

## 2014-07-19 ENCOUNTER — Ambulatory Visit (INDEPENDENT_AMBULATORY_CARE_PROVIDER_SITE_OTHER): Payer: Medicaid Other | Admitting: Obstetrics and Gynecology

## 2014-07-19 VITALS — BP 130/84 | HR 98 | Temp 98.4°F | Wt 181.1 lb

## 2014-07-19 DIAGNOSIS — Z5189 Encounter for other specified aftercare: Secondary | ICD-10-CM

## 2014-07-19 MED ORDER — IBUPROFEN 600 MG PO TABS: 600.0000 mg | ORAL_TABLET | Freq: Four times a day (QID) | ORAL | Status: DC

## 2014-07-19 MED ORDER — ACETAMINOPHEN 500 MG PO TABS: 500.0000 mg | ORAL_TABLET | Freq: Four times a day (QID) | ORAL | Status: DC | PRN

## 2014-07-19 NOTE — Progress Notes (Signed)
Patient ID: Martha Paul, female   DOB: 04/28/1976, 38 y.o.   MRN: [id number] 38 yo [id number] s/p RLTCS on 10/8 presenting today for wound check. Patient is still using ibuprofen for pain control. She denies any fevers or chills, nausea or emesis, or drainage from the incision  GENERAL: Well-developed, well-nourished female in no acute distress.  ABDOMEN: Soft, nontender, nondistended. No organomegaly. Incision: no erythema, induration or drainage. Incision healing very well. Visible small fragment of suture material on left side of the incision, protruding from skin PELVIC: Not performed EXTREMITIES: No cyanosis, clubbing, or edema, 2+ distal pulses.  A/P 38 yo here for wound check - Reassurance provided - RTC in 2 weeks for postpartum visit and re-evaluate incision as per patient request

## 2014-07-20 ENCOUNTER — Ambulatory Visit: Payer: Self-pay

## 2014-07-20 NOTE — Lactation Note (Signed)
This note was copied from the chart of Martha Paul. Lactation Consultation Note Mom had some concerns regarding her pumping and breast milk supply and request LC. Interpreter present.  Discussed strategies with mom to improve breast milk supply while exclusively pumping. Inst mom to pump every 3 hours, once at night, and to massage breasts while pumping. Mom was inst to hand express for 3 to 4 minutes after pumping. Reviewed hand expression technique with mom. Inst mom to power pump once a day. Discussed Fenugreek and inst mom that it may be helpful as long as she continues to pump and hand express, and to follow the inst on the bottle. Enc mom to continue daily STS and make sure she gets plenty of rest and good nutrition.  Will follow up as needed.   Patient Name: Martha Paul HYQMV'HToday's Date: 07/20/2014     Maternal Data    Feeding Feeding Type: Breast Milk with Formula added Length of feed: 45 min  LATCH Score/Interventions                      Lactation Tools Discussed/Used     Consult Status      Lenard ForthSanders, Kishana Battey Fulmer 07/20/2014, 12:45 PM

## 2014-07-21 ENCOUNTER — Ambulatory Visit: Payer: Self-pay | Admitting: Advanced Practice Midwife

## 2014-08-15 ENCOUNTER — Encounter: Payer: Self-pay | Admitting: *Deleted

## 2014-08-18 ENCOUNTER — Ambulatory Visit: Payer: Self-pay

## 2014-08-18 NOTE — Lactation Note (Signed)
This note was copied from the chart of Martha Paul. Lactation Consultation Note  Patient Name: Martha Paul Today's Date: 08/18/2014   Mom had concerns about milk supply.  Infant is 2 months old and mom is pumping 5 times a day getting 15-20 ml each pumping for a total daily production of 75-1590ml.  Mom states if she waits 4-5 hours between pumpings she gets more milk (60 ml).  Mom has [employer] pump  Spoke with mom via interpreter.  Mom has been drinking mothers-milk and was taking Fenugreek but states she is not seeing an increase.  Mom breastfed 2 previous children for 1+ years each with no issues of milk supply; mom denies any issues with thyroid.  When mom pumps she pumps one side and massages the other, then switches sides she pumps and massages/hand expresses the other (single pumping).  Instructed mom to pump both side at same time using hands-on pumping technique with hand expression at end of pumping session.  Taught how to use hands-on technique.  Gave another copy of NICU booklet in Spanish and reviewed booklet with mom pointing out website for hand expression and pumping log in the middle.  Instructed mom to pump every 2 hours during the day and at least once during the night for a total of at least 8 times per day keeping pumping log and bringing back with her to hospital for follow-up with LC.  Reviewed supply and demand.  Mom reports power pumping some and stated she would see a small increase in amount the next day after power pumping.  Reviewed with mom LC felt like decrease in supply was related to pumping habits (single v/s double), decrease in frequency, and lack of hands-on stimulation.  Herbal galactogogue handout information given (blessed thistle, moringa, and fenugreek, and lactation cookies).  Mom has not been doing STS or latching infant in NICU "since they put clothes on" baby.  Encouraged lots of STS with each visit and practice with latching with each  visit.  Encouraged mom to bring pump parts with her to hospital so she can use pumping room after doing STS or latching.  [facility] and is aware of patient desire to do STS and latching with baby when mom visits.       Martha Paul, Martha Paul 08/18/2014, 7:05 PM

## 2015-03-19 ENCOUNTER — Observation Stay (HOSPITAL_COMMUNITY): Payer: Medicaid Other | Admitting: Anesthesiology

## 2015-03-19 ENCOUNTER — Observation Stay (HOSPITAL_COMMUNITY)
Admission: EM | Admit: 2015-03-19 | Discharge: 2015-03-20 | Disposition: A | Discharge status: Home / Self Care | Payer: Medicaid Other | Attending: General Surgery | Admitting: General Surgery

## 2015-03-19 ENCOUNTER — Emergency Department (HOSPITAL_COMMUNITY): Payer: Medicaid Other

## 2015-03-19 ENCOUNTER — Encounter (HOSPITAL_COMMUNITY): Payer: Self-pay | Admitting: Emergency Medicine

## 2015-03-19 ENCOUNTER — Observation Stay (HOSPITAL_COMMUNITY): Payer: Medicaid Other

## 2015-03-19 ENCOUNTER — Encounter (HOSPITAL_COMMUNITY)
Admission: EM | Disposition: A | Discharge status: Home / Self Care | Payer: Self-pay | Source: Home / Self Care | Attending: Emergency Medicine

## 2015-03-19 DIAGNOSIS — K8000 Calculus of gallbladder with acute cholecystitis without obstruction: Secondary | ICD-10-CM | POA: Diagnosis not present

## 2015-03-19 DIAGNOSIS — I10 Essential (primary) hypertension: Secondary | ICD-10-CM | POA: Insufficient documentation

## 2015-03-19 DIAGNOSIS — K8020 Calculus of gallbladder without cholecystitis without obstruction: Secondary | ICD-10-CM | POA: Diagnosis present

## 2015-03-19 DIAGNOSIS — R1011 Right upper quadrant pain: Secondary | ICD-10-CM | POA: Diagnosis present

## 2015-03-19 DIAGNOSIS — Z419 Encounter for procedure for purposes other than remedying health state, unspecified: Secondary | ICD-10-CM

## 2015-03-19 DIAGNOSIS — G43909 Migraine, unspecified, not intractable, without status migrainosus: Secondary | ICD-10-CM | POA: Insufficient documentation

## 2015-03-19 HISTORY — PX: CHOLECYSTECTOMY: SHX55

## 2015-03-19 LAB — COMPREHENSIVE METABOLIC PANEL
ALT: 46 U/L (ref 14–54)
ANION GAP: 9 (ref 5–15)
AST: 33 U/L (ref 15–41)
Albumin: 4.4 g/dL (ref 3.5–5.0)
Alkaline Phosphatase: 58 U/L (ref 38–126)
BUN: 8 mg/dL (ref 6–20)
CO2: 21 mmol/L — AB (ref 22–32)
Calcium: 8.8 mg/dL — ABNORMAL LOW (ref 8.9–10.3)
Chloride: 107 mmol/L (ref 101–111)
Creatinine, Ser: 0.48 mg/dL (ref 0.44–1.00)
GFR calc Af Amer: >60 mL/min (ref >60)
Glucose, Bld: 129 mg/dL — ABNORMAL HIGH (ref 65–99)
Potassium: 3.5 mmol/L (ref 3.5–5.1)
Sodium: 137 mmol/L (ref 135–145)
TOTAL PROTEIN: 7.7 g/dL (ref 6.5–8.1)
Total Bilirubin: 0.6 mg/dL (ref 0.3–1.2)

## 2015-03-19 LAB — URINALYSIS, ROUTINE W REFLEX MICROSCOPIC
BILIRUBIN URINE: NEGATIVE
GLUCOSE, UA: NEGATIVE mg/dL
HGB URINE DIPSTICK: NEGATIVE
Ketones, ur: NEGATIVE mg/dL
LEUKOCYTES UA: NEGATIVE
Nitrite: NEGATIVE
Protein, ur: NEGATIVE mg/dL
SPECIFIC GRAVITY, URINE: 1.012 (ref 1.005–1.030)
UROBILINOGEN UA: 0.2 mg/dL (ref 0.0–1.0)
pH: 8 (ref 5.0–8.0)

## 2015-03-19 LAB — CBC
HCT: 41.7 % (ref 36.0–46.0)
HEMOGLOBIN: 14.2 g/dL (ref 12.0–15.0)
MCH: 30 pg (ref 26.0–34.0)
MCHC: 34.1 g/dL (ref 30.0–36.0)
MCV: 88 fL (ref 78.0–100.0)
Platelets: 223 10*3/uL (ref 150–400)
RBC: 4.74 MIL/uL (ref 3.87–5.11)
RDW: 12.8 % (ref 11.5–15.5)
WBC: 15 10*3/uL — ABNORMAL HIGH (ref 4.0–10.5)

## 2015-03-19 LAB — HCG, QUANTITATIVE, PREGNANCY

## 2015-03-19 LAB — SURGICAL PCR SCREEN
MRSA, PCR: NEGATIVE
STAPHYLOCOCCUS AUREUS: NEGATIVE

## 2015-03-19 LAB — LIPASE, BLOOD: Lipase: 17 U/L — ABNORMAL LOW (ref 22–51)

## 2015-03-19 SURGERY — LAPAROSCOPIC CHOLECYSTECTOMY WITH INTRAOPERATIVE CHOLANGIOGRAM
Anesthesia: General

## 2015-03-19 MED ORDER — IOHEXOL 300 MG/ML  SOLN
INTRAMUSCULAR | Status: DC | PRN
  Administered 2015-03-19 (×2): 50 mL via INTRAVENOUS

## 2015-03-19 MED ORDER — GLYCOPYRROLATE 0.2 MG/ML IJ SOLN
INTRAMUSCULAR | Status: AC
  Filled 2015-03-19: qty 3

## 2015-03-19 MED ORDER — MORPHINE SULFATE 2 MG/ML IJ SOLN
1.0000 mg | INTRAMUSCULAR | Status: DC | PRN
  Administered 2015-03-19 (×3): 2 mg via INTRAVENOUS
  Filled 2015-03-19 (×3): qty 1

## 2015-03-19 MED ORDER — ONDANSETRON HCL 4 MG/2ML IJ SOLN: 4.0000 mg | Freq: Four times a day (QID) | INTRAMUSCULAR | Status: DC | PRN

## 2015-03-19 MED ORDER — MORPHINE SULFATE 4 MG/ML IJ SOLN
4.0000 mg | Freq: Once | INTRAMUSCULAR | Status: AC
  Administered 2015-03-19: 4 mg via INTRAVENOUS
  Filled 2015-03-19: qty 1

## 2015-03-19 MED ORDER — ROCURONIUM BROMIDE 100 MG/10ML IV SOLN
INTRAVENOUS | Status: DC | PRN
  Administered 2015-03-19: 10 mg via INTRAVENOUS
  Administered 2015-03-19: 20 mg via INTRAVENOUS

## 2015-03-19 MED ORDER — PROPOFOL 10 MG/ML IV BOLUS
INTRAVENOUS | Status: DC | PRN
  Administered 2015-03-19: 150 mg via INTRAVENOUS

## 2015-03-19 MED ORDER — 0.9 % SODIUM CHLORIDE (POUR BTL) OPTIME
TOPICAL | Status: DC | PRN
  Administered 2015-03-19: 1000 mL

## 2015-03-19 MED ORDER — ONDANSETRON HCL 4 MG/2ML IJ SOLN
INTRAMUSCULAR | Status: DC | PRN
  Administered 2015-03-19: 4 mg via INTRAVENOUS

## 2015-03-19 MED ORDER — LACTATED RINGERS IR SOLN
Status: DC | PRN
  Administered 2015-03-19: 1000 mL

## 2015-03-19 MED ORDER — MIDAZOLAM HCL 5 MG/5ML IJ SOLN
INTRAMUSCULAR | Status: DC | PRN
  Administered 2015-03-19: 2 mg via INTRAVENOUS

## 2015-03-19 MED ORDER — FENTANYL CITRATE (PF) 100 MCG/2ML IJ SOLN
INTRAMUSCULAR | Status: AC
  Filled 2015-03-19: qty 2

## 2015-03-19 MED ORDER — DEXAMETHASONE SODIUM PHOSPHATE 10 MG/ML IJ SOLN
INTRAMUSCULAR | Status: DC | PRN
  Administered 2015-03-19: 10 mg via INTRAVENOUS

## 2015-03-19 MED ORDER — NEOSTIGMINE METHYLSULFATE 10 MG/10ML IV SOLN
INTRAVENOUS | Status: DC | PRN
  Administered 2015-03-19: 4 mg via INTRAVENOUS

## 2015-03-19 MED ORDER — KCL IN DEXTROSE-NACL 20-5-0.9 MEQ/L-%-% IV SOLN
INTRAVENOUS | Status: DC
  Administered 2015-03-19 (×2): via INTRAVENOUS
  Administered 2015-03-19: 100 mL/h via INTRAVENOUS
  Filled 2015-03-19 (×3): qty 1000

## 2015-03-19 MED ORDER — PANTOPRAZOLE SODIUM 40 MG IV SOLR
40.0000 mg | Freq: Every day | INTRAVENOUS | Status: DC
  Administered 2015-03-19: 40 mg via INTRAVENOUS
  Filled 2015-03-19 (×2): qty 40

## 2015-03-19 MED ORDER — BUPIVACAINE-EPINEPHRINE 0.25% -1:200000 IJ SOLN
INTRAMUSCULAR | Status: AC
  Filled 2015-03-19: qty 1

## 2015-03-19 MED ORDER — FENTANYL CITRATE (PF) 100 MCG/2ML IJ SOLN
INTRAMUSCULAR | Status: DC | PRN
  Administered 2015-03-19: 50 ug via INTRAVENOUS
  Administered 2015-03-19: 100 ug via INTRAVENOUS
  Administered 2015-03-19 (×2): 50 ug via INTRAVENOUS

## 2015-03-19 MED ORDER — FENTANYL CITRATE (PF) 250 MCG/5ML IJ SOLN
INTRAMUSCULAR | Status: AC
  Filled 2015-03-19: qty 5

## 2015-03-19 MED ORDER — SUCCINYLCHOLINE CHLORIDE 20 MG/ML IJ SOLN
INTRAMUSCULAR | Status: DC | PRN
  Administered 2015-03-19: 100 mg via INTRAVENOUS

## 2015-03-19 MED ORDER — LABETALOL HCL 200 MG PO TABS
200.0000 mg | ORAL_TABLET | Freq: Two times a day (BID) | ORAL | Status: DC
Start: 2015-03-19 — End: 2015-03-20
  Administered 2015-03-19 (×2): 200 mg via ORAL
  Filled 2015-03-19 (×5): qty 1

## 2015-03-19 MED ORDER — DEXTROSE 5 % IV SOLN
INTRAVENOUS | Status: AC
  Filled 2015-03-19: qty 2

## 2015-03-19 MED ORDER — FENTANYL CITRATE (PF) 100 MCG/2ML IJ SOLN
25.0000 ug | INTRAMUSCULAR | Status: DC | PRN
  Administered 2015-03-19: 25 ug via INTRAVENOUS

## 2015-03-19 MED ORDER — DEXAMETHASONE SODIUM PHOSPHATE 10 MG/ML IJ SOLN
INTRAMUSCULAR | Status: AC
  Filled 2015-03-19: qty 1

## 2015-03-19 MED ORDER — ONDANSETRON HCL 4 MG/2ML IJ SOLN
INTRAMUSCULAR | Status: AC
  Filled 2015-03-19: qty 2

## 2015-03-19 MED ORDER — LABETALOL HCL 5 MG/ML IV SOLN
INTRAVENOUS | Status: DC | PRN
  Administered 2015-03-19: 5 mg via INTRAVENOUS

## 2015-03-19 MED ORDER — LABETALOL HCL 5 MG/ML IV SOLN
INTRAVENOUS | Status: AC
  Filled 2015-03-19: qty 4

## 2015-03-19 MED ORDER — MIDAZOLAM HCL 2 MG/2ML IJ SOLN
INTRAMUSCULAR | Status: AC
  Filled 2015-03-19: qty 2

## 2015-03-19 MED ORDER — PROPOFOL 10 MG/ML IV BOLUS
INTRAVENOUS | Status: AC
  Filled 2015-03-19: qty 20

## 2015-03-19 MED ORDER — HYDROCODONE-ACETAMINOPHEN 5-325 MG PO TABS
1.0000 | ORAL_TABLET | ORAL | Status: DC | PRN
  Administered 2015-03-19: 1 via ORAL
  Administered 2015-03-20: 2 via ORAL
  Administered 2015-03-20: 1 via ORAL
  Filled 2015-03-19: qty 2
  Filled 2015-03-19 (×2): qty 1

## 2015-03-19 MED ORDER — LACTATED RINGERS IV SOLN
INTRAVENOUS | Status: DC | PRN
  Administered 2015-03-19: 12:00:00 via INTRAVENOUS

## 2015-03-19 MED ORDER — DEXTROSE 5 % IV SOLN
2.0000 g | Freq: Once | INTRAVENOUS | Status: AC
  Administered 2015-03-19: 2 g via INTRAVENOUS
  Filled 2015-03-19: qty 2

## 2015-03-19 MED ORDER — ONDANSETRON HCL 4 MG/2ML IJ SOLN: 4.0000 mg | Freq: Once | INTRAMUSCULAR | Status: DC | PRN

## 2015-03-19 MED ORDER — GLYCOPYRROLATE 0.2 MG/ML IJ SOLN
INTRAMUSCULAR | Status: DC | PRN
  Administered 2015-03-19: 0.6 mg via INTRAVENOUS

## 2015-03-19 MED ORDER — GI COCKTAIL ~~LOC~~
30.0000 mL | Freq: Once | ORAL | Status: AC
  Administered 2015-03-19: 30 mL via ORAL
  Filled 2015-03-19: qty 30

## 2015-03-19 MED ORDER — ONDANSETRON HCL 4 MG/2ML IJ SOLN
4.0000 mg | Freq: Once | INTRAMUSCULAR | Status: AC
  Administered 2015-03-19: 4 mg via INTRAVENOUS
  Filled 2015-03-19: qty 2

## 2015-03-19 MED ORDER — BUPIVACAINE-EPINEPHRINE 0.25% -1:200000 IJ SOLN
INTRAMUSCULAR | Status: DC | PRN
  Administered 2015-03-19: 20 mL

## 2015-03-19 SURGICAL SUPPLY — 31 items
APPLIER CLIP ROT 10 11.4 M/L (STAPLE) ×3
APR CLP MED LRG 11.4X10 (STAPLE) ×1
BAG SPEC RTRVL LRG 6X4 10 (ENDOMECHANICALS) ×1
CATH REDDICK CHOLANGI 4FR 50CM (CATHETERS) IMPLANT
CHLORAPREP W/TINT 26ML (MISCELLANEOUS) ×3 IMPLANT
CLIP APPLIE ROT 10 11.4 M/L (STAPLE) ×1 IMPLANT
COVER MAYO STAND STRL (DRAPES) ×3 IMPLANT
COVER SURGICAL LIGHT HANDLE (MISCELLANEOUS) ×3 IMPLANT
DECANTER SPIKE VIAL GLASS SM (MISCELLANEOUS) ×3 IMPLANT
DRAPE C-ARM 42X120 X-RAY (DRAPES) ×3 IMPLANT
DRAPE LAPAROSCOPIC ABDOMINAL (DRAPES) ×3 IMPLANT
ELECT REM PT RETURN 9FT ADLT (ELECTROSURGICAL) ×3
ELECTRODE REM PT RTRN 9FT ADLT (ELECTROSURGICAL) ×1 IMPLANT
GLOVE BIOGEL PI IND STRL 7.5 (GLOVE) ×1 IMPLANT
GLOVE BIOGEL PI INDICATOR 7.5 (GLOVE) ×2
GLOVE ECLIPSE 7.5 STRL STRAW (GLOVE) ×3 IMPLANT
GOWN STRL REUS W/TWL XL LVL3 (GOWN DISPOSABLE) ×12 IMPLANT
HEMOSTAT SNOW SURGICEL 2X4 (HEMOSTASIS) IMPLANT
HEMOSTAT SURGICEL 4X8 (HEMOSTASIS) ×2 IMPLANT
KIT BASIN OR (CUSTOM PROCEDURE TRAY) ×3 IMPLANT
LIQUID BAND (GAUZE/BANDAGES/DRESSINGS) ×3 IMPLANT
POUCH SPECIMEN RETRIEVAL 10MM (ENDOMECHANICALS) ×2 IMPLANT
SCISSORS LAP 5X35 DISP (ENDOMECHANICALS) ×3 IMPLANT
SET CHOLANGIOGRAPH MIX (MISCELLANEOUS) ×3 IMPLANT
SET IRRIG TUBING LAPAROSCOPIC (IRRIGATION / IRRIGATOR) ×3 IMPLANT
SLEEVE XCEL OPT CAN 5 100 (ENDOMECHANICALS) ×3 IMPLANT
SUT MNCRL AB 4-0 PS2 18 (SUTURE) ×3 IMPLANT
TRAY LAPAROSCOPIC (CUSTOM PROCEDURE TRAY) ×3 IMPLANT
TROCAR BLADELESS OPT 5 100 (ENDOMECHANICALS) ×3 IMPLANT
TROCAR XCEL BLUNT TIP 100MML (ENDOMECHANICALS) ×3 IMPLANT
TROCAR XCEL NON-BLD 11X100MML (ENDOMECHANICALS) ×3 IMPLANT

## 2015-03-19 NOTE — Progress Notes (Signed)
I agree with initial assessment and have recommended proceeding with laparoscopic cholecystectomy with cholangiogram.I discussed the procedure in detail.  The patient was given Agricultural engineered[employer]ational material.  We discussed the risks and benefits of a laparoscopic cholecystectomy and possible cholangiogram including, but not limited to bleeding, infection, injury to surrounding structures such as the intestine or liver, bile leak, retained gallstones, need to convert to an open procedure, prolonged diarrhea, blood clots such as  DVT, common bile duct injury, anesthesia risks, and possible need for additional procedures.  The likelihood of improvement in symptoms and return to the patient's normal status is good. We discussed the typical post-operative recovery course.  Mariella SaaBenjamin T Taira Knabe MD, FACS  03/19/2015, 11:52 AM

## 2015-03-19 NOTE — ED Notes (Signed)
US tech at bedside

## 2015-03-19 NOTE — Op Note (Signed)
Preoperative Diagnosis: cholelithiasis and acute cholecystitis  Postoprative Diagnosis: same  Procedure: Procedure(s): LAPAROSCOPIC CHOLECYSTECTOMY WITH INTRAOPERATIVE CHOLANGIOGRAM   Surgeon: Glenna FellowsHoxworth, Dia Jefferys T   Assistants: Jorje GuildMegan Baird PA  Anesthesia:  General endotracheal anesthesia  Indications: patient presents with 2 days of acute severe epigastric and right upper quadrant abdominal pain. CT scan shows cholelithiasis and some evidence of edema in the gallbladder wall consistent with early acute cholecystitis. We have recommended proceeding with laparoscopic cholecystectomy with cholangiogram. The procedure was discussed with the patient and her husband and all questions answered.  Procedure Detail:  Patient was brought to the operating room, placed in supine position on the operating table, and general endotracheal anesthesia induced. The abdomen was widely sterilely prepped and draped. She received preoperative IV antibiotics. PAS were in place.  Patient timeout was performed and correct procedure verified. Local anesthesia was used to infiltrate trocar sites. Access was obtained with an open Hassan technique at the umbilicus with a 1 cm incision through a mattress suture of 0 Vicryl and pneumoperitoneum established. Under direct vision an 11 mm trocar was placed subxiphoid and 25 mm trochars along the right subcostal margin. The gallbladder was tensely distended and edematous with early acute inflammation. It was decompressed with an aspiration needle. The fundus was elevated up over the liver and the infundibulum retracted inferolaterally. Peritoneum anterior and posterior to close triangle was incised and the distal gallbladder close to radical thoroughly dissected. The cystic duct was dissected out for about a centimeter in the cystic duct gallbladder junction dissected 360 and cystic artery skeletonized and a good critical view obtained. The cystic artery was singly clipped and the  cystic duct clipped at the gallbladder junction. Operative cholangiogram was obtained through the cystic duct which showed good filling of a normal common bile duct and intrahepatic ducts with free flow to the duodenum and no filling defects. The cholangio- cath was removed and the cystic duct and cystic artery were further clipped and divided. The gallbladder was dissected free from its bed using hook cautery. There was more severe inflammation around the fundus where the gallbladder was somewhat intrahepatic. There was some bleeding from a venous lake high on the liver. This was easily controlled with pressure. The gallbladder was placed in an Endo Catch bag. Surgicel was placed in the gallbladder bed after cauterizing and observed for several minutes and there was no bleeding. The gallbladder was brought out through the umbilicus after removing multiple large stones. The end was irrigated and again inspected for hemostasis or evidence of injury and everything looked fine. All CO2 was evacuated and trochars removed. The mattress suture was secured at the umbilicus. Skin incisions were closed with subcuticular Monocryl and Dermabond. Sponge needle and instrument counts were correct.    Findings: Cholelithiasis and acute cholecystitis  Estimated Blood Loss:  less than 100 mL         Drains: none  Blood Given: none          Specimens: gallbladder and contents        Complications:  * No complications entered in OR log *         Disposition: PACU - hemodynamically stable.         Condition: stable

## 2015-03-19 NOTE — H&P (Signed)
Martha Paul is an 39 y.o. female.   Chief Complaint: abdominal pain HPI:  The patient is a 39 year old Hispanic female who presents to the emergency department with a history of abdominal pain. The pain started 2 days ago. The pain has mostly been located in her epigastric region. The pain has been associated with significant nausea and vomiting but she denies any fevers. The pain has only gotten worse over the last couple days. She came to the emergency department where an ultrasound did show stones in the gallbladder with minimal gallbladder wall thickening and no ductal dilatation. Her white count was elevated but her liver functions were normal. Her lipase was also normal. Her pain has not gotten better at all since coming to the emergency department.  Past Medical History  Diagnosis Date  . Migraine   . PIH (pregnancy induced hypertension)     with all three pregnancies    Past Surgical History  Procedure Laterality Date  . Cesarean section  07/29/2004  . Cesarean section N/A 06/06/2014    Procedure: CESAREAN SECTION;  Surgeon: Guss Bunde, MD;  Location: [facility];  Service: Obstetrics;  Laterality: N/A;    No family history on file. Social History:  reports that she has never smoked. She does not have any smokeless tobacco history on file. She reports that she does not drink alcohol or use illicit drugs.  Allergies: No Known Allergies   (Not in a hospital admission)  Results for orders placed or performed during the hospital encounter of 03/19/15 (from the past 48 hour(s))  Lipase, blood     Status: Abnormal   Collection Time: 03/19/15  2:38 AM  Result Value Ref Range   Lipase 17 (L) 22 - 51 U/L  Comprehensive metabolic panel     Status: Abnormal   Collection Time: 03/19/15  2:38 AM  Result Value Ref Range   Sodium 137 135 - 145 mmol/L   Potassium 3.5 3.5 - 5.1 mmol/L   Chloride 107 101 - 111 mmol/L   CO2 21 (L) 22 - 32 mmol/L   Glucose, Bld 129 (H) 65 - 99 mg/dL    BUN 8 6 - 20 mg/dL   Creatinine, Ser 0.48 0.44 - 1.00 mg/dL   Calcium 8.8 (L) 8.9 - 10.3 mg/dL   Total Protein 7.7 6.5 - 8.1 g/dL   Albumin 4.4 3.5 - 5.0 g/dL   AST 33 15 - 41 U/L   ALT 46 14 - 54 U/L   Alkaline Phosphatase 58 38 - 126 U/L   Total Bilirubin 0.6 0.3 - 1.2 mg/dL   GFR calc non Af Amer >60 >60 mL/min   GFR calc Af Amer >60 >60 mL/min    Comment: (NOTE) The eGFR has been calculated using the CKD EPI equation. This calculation has not been validated in all clinical situations. eGFR's persistently <60 mL/min signify possible Chronic Kidney Disease.    Anion gap 9 5 - 15  CBC     Status: Abnormal   Collection Time: 03/19/15  2:38 AM  Result Value Ref Range   WBC 15.0 (H) 4.0 - 10.5 K/uL   RBC 4.74 3.87 - 5.11 MIL/uL   Hemoglobin 14.2 12.0 - 15.0 g/dL   HCT 41.7 36.0 - 46.0 %   MCV 88.0 78.0 - 100.0 fL   MCH 30.0 26.0 - 34.0 pg   MCHC 34.1 30.0 - 36.0 g/dL   RDW 12.8 11.5 - 15.5 %   Platelets 223 150 - 400 K/uL  hCG, quantitative, pregnancy     Status: None   Collection Time: 03/19/15  2:38 AM  Result Value Ref Range   hCG, Beta Chain, Quant, S <1 <5 mIU/mL    Comment:          GEST. AGE      CONC.  (mIU/mL)   <=1 WEEK        5 - 50     2 WEEKS       50 - 500     3 WEEKS       100 - 10,000     4 WEEKS     1,000 - 30,000     5 WEEKS     3,500 - 115,000   6-8 WEEKS     12,000 - 270,000    12 WEEKS     15,000 - 220,000        FEMALE AND NON-PREGNANT FEMALE:     LESS THAN 5 mIU/mL   Urinalysis, Routine w reflex microscopic (not at [facility])     Status: Abnormal   Collection Time: 03/19/15  3:34 AM  Result Value Ref Range   Color, Urine YELLOW YELLOW   APPearance CLOUDY (A) CLEAR   Specific Gravity, Urine 1.012 1.005 - 1.030   pH 8.0 5.0 - 8.0   Glucose, UA NEGATIVE NEGATIVE mg/dL   Hgb urine dipstick NEGATIVE NEGATIVE   Bilirubin Urine NEGATIVE NEGATIVE   Ketones, ur NEGATIVE NEGATIVE mg/dL   Protein, ur NEGATIVE NEGATIVE mg/dL   Urobilinogen, UA 0.2 0.0 -  1.0 mg/dL   Nitrite NEGATIVE NEGATIVE   Leukocytes, UA NEGATIVE NEGATIVE    Comment: MICROSCOPIC NOT DONE ON URINES WITH NEGATIVE PROTEIN, BLOOD, LEUKOCYTES, NITRITE, OR GLUCOSE <1000 mg/dL.   US Abdomen Limited Ruq  03/19/2015   CLINICAL DATA:  RIGHT upper quadrant pain for at least 2 weeks.  EXAM: US ABDOMEN LIMITED - RIGHT UPPER QUADRANT  COMPARISON:  None.  FINDINGS: Gallbladder:  Echogenic layering gallbladder sludge, superimposed subcentimeter echogenic gallstones with acoustic shadowing. Gallbladder is mildly distended. No gallbladder wall thickening. No sonographic Murphy's sign elicited.  Common bile duct:  Diameter: 5 mm, no sonographic findings of choledocholithiasis.  Liver:  Diffusely mildly echogenic without intrahepatic biliary dilatation. Hepatopetal portal vein.  IMPRESSION: Sludge and gallstones without sonographic findings of acute cholecystitis.  Hepatic steatosis/ hepatocellular disease.   Electronically Signed   By: Elon Alas M.D.   On: 03/19/2015 03:27    Review of Systems  Constitutional: Negative.   HENT: Negative.   Eyes: Negative.   Respiratory: Negative.   Cardiovascular: Negative.   Gastrointestinal: Positive for nausea, vomiting and abdominal pain.  Genitourinary: Negative.   Musculoskeletal: Negative.   Skin: Negative.   Neurological: Negative.   Endo/Heme/Allergies: Negative.   Psychiatric/Behavioral: Negative.     Blood pressure 150/90, pulse 90, temperature 99.1 F (37.3 C), temperature source Oral, resp. rate 22, height _0  (1.549 m), weight 74.39 kg (164 lb), last menstrual period 03/01/2015, SpO2 98 %, currently breastfeeding. Physical Exam  Constitutional: She is oriented to person, place, and time. She appears well-developed and well-nourished.  HENT:  Head: Normocephalic and atraumatic.  Eyes: Conjunctivae and EOM are normal. Pupils are equal, round, and reactive to light.  Neck: Normal range of motion. Neck supple.  Cardiovascular:  Normal rate, regular rhythm and normal heart sounds.   Respiratory: Effort normal and breath sounds normal.  GI: Soft.  There is tenderness in the epigastric and RUQ areas. No guarding or peritonitis.  No palpable mass  Musculoskeletal: Normal range of motion.  Neurological: She is alert and oriented to person, place, and time.  Skin: Skin is warm and dry.  Psychiatric: She has a normal mood and affect. Her behavior is normal.     Assessment/Plan  The patient appears to have symptomatic gallstones. Because of the risk of further painful episodes and possible pancreatitis or think she would benefit from having her gallbladder removed. I have discussed with her in detail the risks and benefits of the operation to remove the gallbladder as well as some of the technical aspects and she understands and wishes to proceed. We will plan to admit her to the hospital and we will discuss her situation with the primary surgical team.  TOTH III,PAUL S 03/19/2015, 6:05 AM

## 2015-03-19 NOTE — Progress Notes (Signed)
Pt arrived from ER via stretcher accompanied by RN. Orders reviewed. Pt stable. Will continue to assess

## 2015-03-19 NOTE — ED Provider Notes (Signed)
CSN: [id number]     Arrival date & time 03/19/15  0201 History   First MD Initiated Contact with Patient 03/19/15 [phone]     Chief Complaint  Patient presents with  . Abdominal Pain    RUQ     (Consider location/radiation/quality/duration/timing/severity/associated sxs/prior Treatment) HPI Comments: Patient is a [age] year old female with history of migraines. She presents for evaluation of right upper quadrant and epigastric abdominal pain that started 2 days ago. She denies any fevers or chills. She admits to 3 episodes of vomiting today. She has had prior C-sections, but no other abdominal surgeries. Her last period was last month and normal.  Patient is a 39 y.o. female presenting with abdominal pain. The history is provided by the patient.  Abdominal Pain Pain location:  Epigastric and RUQ Pain quality: cramping   Pain radiates to:  Does not radiate Pain severity:  Moderate Onset quality:  Sudden Duration:  2 days Timing:  Intermittent Progression:  Worsening Chronicity:  New Context: not sick contacts and not suspicious food intake   Relieved by:  Nothing Worsened by:  Eating Ineffective treatments:  None tried Associated symptoms: nausea and vomiting   Associated symptoms: no constipation, no diarrhea, no fatigue and no fever     Past Medical History  Diagnosis Date  . Migraine   . PIH (pregnancy induced hypertension)     with all three pregnancies   Past Surgical History  Procedure Laterality Date  . Cesarean section  07/29/2004  . Cesarean section N/A 06/06/2014    Procedure: CESAREAN SECTION;  Surgeon: Lesly DukesKelly H Leggett, MD;  Location: [facility];  Service: Obstetrics;  Laterality: N/A;   No family history on file. History  Substance Use Topics  . Smoking status: Never Smoker   . Smokeless tobacco: Not on file  . Alcohol Use: No   OB History    Gravida Para Term Preterm AB TAB SAB Ectopic Multiple Living   6 5 4 1 1  1   5      Review of Systems  Constitutional:  Negative for fever and fatigue.  Gastrointestinal: Positive for nausea, vomiting and abdominal pain. Negative for diarrhea and constipation.  All other systems reviewed and are negative.     Allergies  Review of patient's allergies indicates no known allergies.  Home Medications   Prior to Admission medications   Medication Sig Start Date End Date Taking? Authorizing Provider  acetaminophen (TYLENOL) 500 MG tablet Take 1 tablet (500 mg total) by mouth every 6 (six) hours as needed. 07/19/14   Peggy Constant, MD  ibuprofen (ADVIL,MOTRIN) 600 MG tablet Take 1 tablet (600 mg total) by mouth every 6 (six) hours. 07/19/14   Peggy Constant, MD  labetalol (NORMODYNE) 200 MG tablet Take 1 tablet (200 mg total) by mouth 2 (two) times daily. 05/29/14   Peggy Constant, MD  labetalol (NORMODYNE) 200 MG tablet Take 1 tablet (200 mg total) by mouth 2 (two) times daily. 06/09/14   Willodean Rosenthalarolyn Harraway-Smith, MD  oxyCODONE-acetaminophen (PERCOCET/ROXICET) 5-325 MG per tablet Take 1 tablet by mouth every 4 (four) hours as needed (for pain scale less than 7). 06/09/14   Willodean Rosenthalarolyn Harraway-Smith, MD  prenatal vitamin w/FE, FA (PRENATAL 1 + 1) 27-1 MG TABS tablet Take 1 tablet by mouth daily at 12 noon.    Historical Provider, MD   BP 158/108 mmHg  Pulse 92  Temp(Src) 99.1 F (37.3 C) (Oral)  Resp 23  Ht 5\' 1"  (1.549 m)  Wt 164 lb (74.39 kg)  BMI 31.00 kg/m2  SpO2 100%  LMP 03/01/2015 (Exact Date) Physical Exam  Constitutional: She is oriented to person, place, and time. She appears well-developed and well-nourished. No distress.  HENT:  Head: Normocephalic and atraumatic.  Neck: Normal range of motion. Neck supple.  Cardiovascular: Normal rate and regular rhythm.  Exam reveals no gallop and no friction rub.   No murmur heard. Pulmonary/Chest: Effort normal and breath sounds normal. No respiratory distress. She has no wheezes.  Abdominal: Soft. Bowel sounds are normal. She exhibits distension. There is no  tenderness.  There is tenderness to palpation in the epigastrium and right upper quadrant. There is no rebound and no guarding.  Musculoskeletal: Normal range of motion.  Neurological: She is alert and oriented to person, place, and time.  Skin: Skin is warm and dry. She is not diaphoretic.  Nursing note and vitals reviewed.   ED Course  Procedures (including critical care time) Labs Review Labs Reviewed  LIPASE, BLOOD  COMPREHENSIVE METABOLIC PANEL  CBC  URINALYSIS, ROUTINE W REFLEX MICROSCOPIC (NOT AT [facility])  HCG, QUANTITATIVE, PREGNANCY    Imaging Review No results found.   EKG Interpretation None      MDM   Final diagnoses:  RUQ pain    Patient presents here with epigastric and right upper quadrant pain that is consistent with biliary colic. She has a white count of 15,000, however normal LFTs and lipase. She has received 3 doses of morphine and a GI cocktail and is not feeling much better. Her ultrasound shows sludge within the gallbladder along with gallstones. I've discussed these findings with Dr. Carolynne Edouard from general surgery and he will send the general surgery team to evaluate her in the morning.    Geoffery Lyons, MD 03/19/15 [phone]

## 2015-03-19 NOTE — Anesthesia Preprocedure Evaluation (Signed)
Anesthesia Evaluation  Patient identified by MRN, date of birth, ID band Patient awake    Reviewed: Allergy & Precautions, NPO status , Patient's Chart, lab work & pertinent test results  History of Anesthesia Complications Negative for: history of anesthetic complications  Airway Mallampati: II  TM Distance: >3 FB Neck ROM: Full    Dental no notable dental hx. (+) Dental Advisory Given   Pulmonary neg pulmonary ROS,  breath sounds clear to auscultation  Pulmonary exam normal       Cardiovascular hypertension, Normal cardiovascular examRhythm:Regular Rate:Normal     Neuro/Psych  Headaches, negative psych ROS   GI/Hepatic negative GI ROS, Neg liver ROS,   Endo/Other  negative endocrine ROS  Renal/GU negative Renal ROS  negative genitourinary   Musculoskeletal negative musculoskeletal ROS (+)   Abdominal   Peds negative pediatric ROS (+)  Hematology negative hematology ROS (+)   Anesthesia Other Findings   Reproductive/Obstetrics negative OB ROS                             Anesthesia Physical Anesthesia Plan  ASA: II  Anesthesia Plan: General   Post-op Pain Management:    Induction: Intravenous, Cricoid pressure planned and Rapid sequence  Airway Management Planned: Oral ETT  Additional Equipment:   Intra-op Plan:   Post-operative Plan:   Informed Consent: I have reviewed the patients History and Physical, chart, labs and discussed the procedure including the risks, benefits and alternatives for the proposed anesthesia with the patient or authorized representative who has indicated his/her understanding and acceptance.   Dental advisory given  Plan Discussed with: CRNA  Anesthesia Plan Comments: (Consent with phone interpreter)        Anesthesia Quick Evaluation

## 2015-03-19 NOTE — Anesthesia Procedure Notes (Signed)
Procedure Name: Intubation Date/Time: 03/19/2015 12:43 PM Performed by: Delphia GratesHANDLER, Susa Bones Pre-anesthesia Checklist: Emergency Drugs available, Patient identified, Suction available and Patient being monitored Patient Re-evaluated:Patient Re-evaluated prior to inductionOxygen Delivery Method: Circle system utilized Preoxygenation: Pre-oxygenation with 100% oxygen Intubation Type: IV induction, Rapid sequence and Cricoid Pressure applied Laryngoscope Size: Mac and 4 Grade View: Grade I Tube type: Oral Tube size: 7.5 mm Number of attempts: 1 Airway Equipment and Method: Stylet Placement Confirmation: ETT inserted through vocal cords under direct vision Secured at: 20 cm Tube secured with: Tape Dental Injury: Teeth and Oropharynx as per pre-operative assessment

## 2015-03-19 NOTE — Transfer of Care (Signed)
Immediate Anesthesia Transfer of Care Note  Patient: Martha Paul  Procedure(s) Performed: Procedure(s): LAPAROSCOPIC CHOLECYSTECTOMY WITH INTRAOPERATIVE CHOLANGIOGRAM (N/A)  Patient Location: PACU  Anesthesia Type:General  Level of Consciousness: awake and patient cooperative  Airway & Oxygen Therapy: Patient Spontanous Breathing and Patient connected to face mask oxygen  Post-op Assessment: Report given to RN and Post -op Vital signs reviewed and stable  Post vital signs: Reviewed and stable  Last Vitals:  Filed Vitals:   03/19/15 0739  BP: 148/91  Pulse: 88  Temp: 36.7 C  Resp: 20    Complications: No apparent anesthesia complications

## 2015-03-19 NOTE — Progress Notes (Signed)
Patient arrived back from surgery via bed accompanied by RN. Pt drowsy but easy to arouse. Spouse at bedside. Will continue to assess

## 2015-03-19 NOTE — ED Notes (Signed)
Patient c/o RUQ abd pain. Pain started Saturday. C/o vomiting yesterday and today. Pain is worse after eating. Patient taking tylenol at home for pain.

## 2015-03-20 ENCOUNTER — Encounter (HOSPITAL_COMMUNITY): Payer: Self-pay | Admitting: General Surgery

## 2015-03-20 DIAGNOSIS — K8000 Calculus of gallbladder with acute cholecystitis without obstruction: Secondary | ICD-10-CM | POA: Diagnosis present

## 2015-03-20 MED ORDER — HYDROCODONE-ACETAMINOPHEN 5-325 MG PO TABS: 1.0000 | ORAL_TABLET | Freq: Four times a day (QID) | ORAL | Status: AC | PRN

## 2015-03-20 NOTE — Discharge Instructions (Addendum)
Your appointment is at 3:45, please arrive at least 30 min before your appointment to complete your check in paperwork.  If you are unable to arrive 30 min prior to your appointment time we may have to cancel or reschedule you.  LAPAROSCOPIC SURGERY: POST OP INSTRUCTIONS  1. DIET: Follow a light bland diet the first 24 hours after arrival home, such as soup, liquids, crackers, etc. Be sure to include lots of fluids daily. Avoid fast food or heavy meals as your are more likely to get nauseated. Eat a low fat the next few days after surgery.  2. Take your usually prescribed home medications unless otherwise directed. 3. PAIN CONTROL:  1. Pain is best controlled by a usual combination of three different methods TOGETHER:  1. Ice/Heat 2. Over the counter pain medication 3. Prescription pain medication 2. Most patients will experience some swelling and bruising around the incisions. Ice packs or heating pads (30-60 minutes up to 6 times a day) will help. Use ice for the first few days to help decrease swelling and bruising, then switch to heat to help relax tight/sore spots and speed recovery. Some people prefer to use ice alone, heat alone, alternating between ice & heat. Experiment to what works for you. Swelling and bruising can take several weeks to resolve.  3. It is helpful to take an over-the-counter pain medication regularly for the first few weeks. Choose one of the following that works best for you:  1. Naproxen (Aleve, etc) Two 220mg  tabs twice a day 2. Ibuprofen (Advil, etc) Three 200mg  tabs four times a day (every meal & bedtime) 3. Acetaminophen (Tylenol, etc) 500-650mg  four times a day (every meal & bedtime) 4. A prescription for pain medication (such as oxycodone, hydrocodone, etc) should be given to you upon discharge. Take your pain medication as prescribed.  1. If you are having problems/concerns with the prescription medicine (does not control pain, nausea, vomiting, rash, itching,  etc), please call us [phone] to see if we need to switch you to a different pain medicine that will work better for you and/or control your side effect better. 2. If you need a refill on your pain medication, please contact your pharmacy. They will contact our office to request authorization. Prescriptions will not be filled after 5 pm or on week-ends. 4. Avoid getting constipated. Between the surgery and the pain medications, it is common to experience some constipation. Increasing fluid intake and taking a fiber supplement (such as Metamucil, Citrucel, FiberCon, MiraLax, etc) 1-2 times a day regularly will usually help prevent this problem from occurring. A mild laxative (prune juice, Milk of Magnesia, MiraLax, etc) should be taken according to package directions if there are no bowel movements after 48 hours.  5. Watch out for diarrhea. If you have many loose bowel movements, simplify your diet to bland foods & liquids for a few days. Stop any stool softeners and decrease your fiber supplement. Switching to mild anti-diarrheal medications (Kayopectate, Pepto Bismol) can help. If this worsens or does not improve, please call us. 6. Wash / shower every day. You may shower over the dressings as they are waterproof. Continue to shower over incision(s) after the dressing is off. 7. Remove your waterproof bandages 5 days after surgery. You may leave the incision open to air. You may replace a dressing/Band-Aid to cover the incision for comfort if you wish.  8. ACTIVITIES as tolerated:  1. You may resume regular (light) daily activities beginning the next day--such as  daily self-care, walking, climbing stairs--gradually increasing activities as tolerated. If you can walk 30 minutes without difficulty, it is safe to try more intense activity such as jogging, treadmill, bicycling, low-impact aerobics, swimming, etc. 2. Save the most intensive and strenuous activity for last such as sit-ups, heavy lifting,  contact sports, etc Refrain from any heavy lifting or straining until you are off narcotics for pain control.  3. DO NOT PUSH THROUGH PAIN. Let pain be your guide: If it hurts to do something, don't do it. Pain is your body warning you to avoid that activity for another week until the pain goes down. 4. You may drive when you are no longer taking prescription pain medication, you can comfortably wear a seatbelt, and you can safely maneuver your car and apply brakes. 5. You may have sexual intercourse when it is comfortable.  9. FOLLOW UP in our office  1. Please call [facility] at [phone] to set up an appointment to see your surgeon in the office for a follow-up appointment approximately 2-3 weeks after your surgery. 2. Make sure that you call for this appointment the day you arrive home to insure a convenient appointment time.      10. IF YOU HAVE DISABILITY OR FAMILY LEAVE FORMS, BRING THEM TO THE               OFFICE FOR PROCESSING.   WHEN TO CALL us [phone]:  1. Poor pain control 2. Reactions / problems with new medications (rash/itching, nausea, etc)  3. Fever over 101.5 F (38.5 C) 4. Inability to urinate 5. Nausea and/or vomiting 6. Worsening swelling or bruising 7. Continued bleeding from incision. 8. Increased pain, redness, or drainage from the incision  The clinic staff is available to answer your questions during regular business hours (8:30am-5pm). Please dont hesitate to call and ask to speak to one of our nurses for clinical concerns.  If you have a medical emergency, go to the nearest emergency room or call 911.  A surgeon from [facility] is always on call at the [facility], [place]  [address], [place], [place] [address] ?  MAIN: [phone] ? TOLL FREE: [phone] ?  FAX [phone]  [email or web address]  ----------------------------------------------------------------  CIRUGIA  LAPAROSCOPICA: INSTRUCCIONES DE POST OPERATORIO.  Revise siempre los documentos que le entreguen en el lugar donde se ha hecho la [place].  SI USTED NECESITA DOCUMENTOS DE INCAPACIDAD (DISABLE) O DE PERMISO FAMILAR (FAMILY LEAVE) NECESITA TRAERLOS A LA OFICINA PARA QUE SEAN PROCESADOS. NO  SE LOS DE A SU DOCTOR. 1. A su alta del hospital se le dara una receta para Human resources officer. Tomela como ha sido recetada, si la necesita. Si no la necesita puede tomar, Acetaminofen (Tylenol) o Ibuprofen (Advil) para aliviar dolor moderado. 2. Continue tomando el resto de sus medicinas. 3. Si necesita rellenar la receta, llame a la farmacia. ellos contactan a nuestra oficina pidiendo autorizacion. Este tipo de receta no pueden ser [employer] de las  5pm o [employer] fines de [place]. 4. Con relacion a la dieta: debe ser [employer] primeros dias despues que llege a la casa. Ejemplo: sopas y galleticas. Tome bastante liquido esos dias. 5. La mayoria de los pacientes padecen de inflamacion y cambio de coloracion de la piel alrededor de las incisiones. esto toma dias en resolver.  pnerse una bolsa de hielo en el area affectada ayuda..  6. Es comun tambien tener un  poco de estrenimiento si esta tomado medicinas para Chief Technology Officer. incremente la cantidad de liquidos a tomar y Engineer, production (Colace) esto previene el problema. Si ya tiene estrenimiento, es Designer, jewellery no ha defecado en 48 horas, puede tomar un laxativo (Milk of Magnesia or Miralax) uselo como el paquete le explica. 7.  A menos que se le diga algo diferente. Remueva el bendaje a las 24-48 horas despues dela [place]. y puede banarse en la ducha sin ningun problema. usted puede tener steri-strips (pequenas curitas transparentes en la piel puesta encima de la incision)  Estas banditas strips should be left on the skin for 7-10 days.   Si su cirujano puso pegamento encima de la incision usted puede banarse bajo la ducha en 24 horas. Este pegamento empezara a caerse  en las proximas 2-3 semanas. Si le pusieron suturas o presillas (grapos) estos seran quitados en su proxima cita en la oficina. Marland Kitchen a. ACTIVIDADES:  Puede hacer actividad ligera.  Como caminar , subir escaleras y poco a poco irlas incrementando tanto como las [place]. Puede tener relaciones sexuales cuando sea comfortable. No carge objetos pesados o haga esfuerzos que no sean aprovados por su doctor. b. Puede manejar en cuanto no esta tomando medicamentos fuertes (narcoticos) para Chief Technology Officer, pueda abrochar confortablemente el cinturon de seguridad, y pueda Personnel officer y usar los pedales de su vehiculo con seguridad. c. PUEDE REGRESAR A TRABAJAR  8. Debe ver a su doctor para una cita de seguimiento en 2-3 semanas despues de la [place].  9. OTRAS ISNSTRUCCIONES:___________________________________________________________________________________ Debbora Lacrosse A SU MEDICO: 1. FIEBRE mayor de  101.0 2. No produccion de [place]. 3. Sangramiento continue de la herida 4. Incremento de dolor, enrojecimientio o drenaje de la herida (incision) 5. Incremento de dolor abdominal.  The clinic staff is available to answer your questions during regular business hours.  Please dont hesitate to call and ask to speak to one of the nurses for clinical concerns.  If you have a medical emergency, go to the nearest emergency room or call 911.  A surgeon from [facility] is always on call at the hospital. [address], [address], [place], [place]  [address] ? [address] [address], [place], [place]   [address] [phone] ? [phone] ? FAX [phone] Web site: [email or web address]

## 2015-03-20 NOTE — Progress Notes (Signed)
Patient is alert and oriented, vital signs are stable, incisions are within normal limits, patient is tolerating diet without complaints of nausea or vomiting, discharge instructions reviewed with patient with spanish interpreter present, stressed the importance of her keeping her appointment for this following Thursday with the [facility], questions and concerns answered via interpreter, prescription for norco given to patient Stanford BreedBracey, Azeem Poorman N RN 1:49 PM 03-20-2015

## 2015-03-20 NOTE — Care Management Note (Signed)
Case Management Note  Patient Details  Name: Lonni Fixngrid Barrios Lopez MRN: [id number] Date of Birth: 09/24/1975  Subjective/Objective: cholecystitis                   Action/Plan:from home   Expected Discharge Date:                  Expected Discharge Plan:  Home/Self Care  In-House Referral:     Discharge planning Services  CM Consult, Follow-up appt scheduled  Post Acute Care Choice:    Choice offered to:     DME Arranged:    DME Agency:     HH Arranged:    HH Agency:     Status of Service:     Medicare Important Message Given:    Date Medicare IM Given:    Medicare IM give by:    Date Additional Medicare IM Given:    Additional Medicare Important Message give by:     If discussed at Long Length of Stay Meetings, dates discussed:    Additional CommentsGeni Bers:  Obdulia Steier, RN 03/20/2015, 11:11 AM

## 2015-03-20 NOTE — Anesthesia Postprocedure Evaluation (Signed)
  Anesthesia Post-op Note  Patient: Martha Paul  Procedure(s) Performed: Procedure(s) (LRB): LAPAROSCOPIC CHOLECYSTECTOMY WITH INTRAOPERATIVE CHOLANGIOGRAM (N/A)  Patient Location: PACU  Anesthesia Type: General  Level of Consciousness: awake and alert   Airway and Oxygen Therapy: Patient Spontanous Breathing  Post-op Pain: mild  Post-op Assessment: Post-op Vital signs reviewed, Patient's Cardiovascular Status Stable, Respiratory Function Stable, Patent Airway and No signs of Nausea or vomiting  Last Vitals:  Filed Vitals:   03/20/15 0535  BP: 97/61  Pulse: 95  Temp: 37.1 C  Resp: 16    Post-op Vital Signs: stable   Complications: No apparent anesthesia complications

## 2015-03-20 NOTE — Discharge Summary (Signed)
[facility] Discharge Summary   Patient ID: Martha Paul MRN: [id number] DOB/AGE: 38/10/1975 38 y.o.  Admit date: 03/19/2015 Discharge date: 03/20/2015  Admitting Diagnosis: Symptomatic cholelithiasis  Discharge Diagnosis Patient Active Problem List   Diagnosis Date Noted  . Cholecystitis, acute with cholelithiasis 03/20/2015  . Gallstones 03/19/2015  . Pre-eclampsia 06/06/2014  . Supervision of high-risk pregnancy 05/29/2014  . Previous cesarean delivery, antepartum 05/29/2014  . [facility], [facility]2014  . Migraine headache without aura 05/29/2014  . AMA (advanced maternal age) multigravida 35+ 05/29/2014  . Hypertension in pregnancy, postpartum condition 05/29/2014    Consultants None  Imaging: Dg Cholangiogram Operative  03/19/2015   CLINICAL DATA:  Laparoscopic cholecystectomy.  EXAM: INTRAOPERATIVE CHOLANGIOGRAM  FLUOROSCOPY TIME:  13 seconds  COMPARISON:  Right upper quadrant abdominal ultrasound - 03/19/2015  FINDINGS: Intraoperative cholangiographic images of the right upper abdominal quadrant during laparoscopic cholecystectomy are provided for review.  Surgical clips overlie the expected location of the gallbladder fossa.  Contrast injection demonstrates selective cannulation of the central aspect of the cystic duct.  There is passage of contrast through the central aspect of the cystic duct with filling of a non dilated common bile duct. There is passage of contrast though the CBD and into the descending portion of the duodenum.  There is minimal reflux of injected contrast into the common hepatic duct and central aspect of the non dilated intrahepatic biliary system.  There are no discrete filling defects within the opacified portions of the biliary system to suggest the presence of choledocholithiasis.  IMPRESSION: No evidence of choledocholithiasis.   Electronically Signed   By: Simonne ComeJohn  Watts M.D.   On: 03/19/2015  13:50   [place] Abdomen Limited Ruq  03/19/2015   CLINICAL DATA:  RIGHT upper quadrant pain for at least 2 weeks.  EXAM: US ABDOMEN LIMITED - RIGHT UPPER QUADRANT  COMPARISON:  None.  FINDINGS: Gallbladder:  Echogenic layering gallbladder sludge, superimposed subcentimeter echogenic gallstones with acoustic shadowing. Gallbladder is mildly distended. No gallbladder wall thickening. No sonographic Murphy's sign elicited.  Common bile duct:  Diameter: 5 mm, no sonographic findings of choledocholithiasis.  Liver:  Diffusely mildly echogenic without intrahepatic biliary dilatation. Hepatopetal portal vein.  IMPRESSION: Sludge and gallstones without sonographic findings of acute cholecystitis.  Hepatic steatosis/ hepatocellular disease.   Electronically Signed   By: Awilda Metroourtnay  Bloomer M.D.   On: 03/19/2015 03:27    Procedures Dr. Johna SheriffHoxworth (03/19/15) - [facility]ar[facility]   Hospital Course:  39 year old Hispanic female who presents to the emergency department with a history of abdominal pain. The pain started 2 days ago. The pain has mostly been located in her epigastric region. The pain has been associated with significant nausea and vomiting but she denies any fevers. The pain has only gotten worse over the last couple days. She came to the emergency department where an ultrasound did show stones in the gallbladder with minimal gallbladder wall thickening and no ductal dilatation. Her white count was elevated but her liver functions were normal. Her lipase was also normal. Her pain has not gotten better at all since coming to the emergency department.  Patient was admitted and underwent procedure listed above.  Tolerated procedure well and was transferred to the floor.  Diet was advanced as tolerated.  On POD #1, the patient was voiding well, tolerating diet, ambulating well, pain well controlled, vital signs stable, incisions c/d/i and felt stable for discharge home.  Patient will follow up  in our office in 3 weeks and knows to call with questions or concerns.  I discussed her post-operative findings and post-op instructions for 30 minutes using the interpretor phone ([employer] interpret[employer]).  Physical Exam: General:  Alert, NAD, pleasant, comfortable Abd:  Soft, ND, mild tenderness, incisions C/D/I    Medication List    STOP taking these medications        labetalol 200 MG tablet  Commonly known as:  NORMODYNE      TAKE these medications        acetaminophen 500 MG tablet  Commonly known as:  TYLENOL  Take 1 tablet (500 mg total) by mouth every 6 (six) hours as needed.     HYDROcodone-acetaminophen 5-325 MG per tablet  Commonly known as:  NORCO/VICODIN  Take 1-2 tablets by mouth every 6 (six) hours as needed for moderate pain or severe pain.         Follow-up Information    Follow up with [facility] [place]. Go on 04/10/2015.   Why:  For post-operation check. Your appointment is at 3:45, please arrive at least 30 min before your appointment to complete your check in paperwork.  If you are unable to arrive 30 min prior to your appointment time we may have to cancel or reschedule you.   Contact information:   [address] [place] [address] [phone]      Signed: Nonie Hoyer, [facility] [phone]  03/20/2015, 8:53 AM

## 2015-03-20 NOTE — Progress Notes (Signed)
Spoke with pt's husband who understands English, concerning PCP and [facility]. Appointment made for pt on 03/22/15 at 11:30 AM. Pt's RN is aware and will use translator to give this information to pt.

## 2015-03-22 ENCOUNTER — Ambulatory Visit: Payer: Self-pay | Attending: Physician Assistant | Admitting: Physician Assistant

## 2015-03-22 VITALS — BP 120/87 | HR 103 | Temp 97.5°F | Resp 18 | Ht 60.0 in | Wt 160.0 lb

## 2015-03-22 DIAGNOSIS — Z9289 Personal history of other medical treatment: Secondary | ICD-10-CM

## 2015-03-22 DIAGNOSIS — Z09 Encounter for follow-up examination after completed treatment for conditions other than malignant neoplasm: Secondary | ICD-10-CM

## 2015-03-22 LAB — CMP AND LIVER
ALK PHOS: 154 U/L — AB (ref 39–117)
ALT: 228 U/L — ABNORMAL HIGH (ref 0–35)
AST: 90 U/L — ABNORMAL HIGH (ref 0–37)
Albumin: 3.7 g/dL (ref 3.5–5.2)
BILIRUBIN DIRECT: 0.2 mg/dL (ref 0.0–0.3)
BUN: 7 mg/dL (ref 6–23)
CALCIUM: 9.6 mg/dL (ref 8.4–10.5)
CO2: 24 meq/L (ref 19–32)
Chloride: 100 mEq/L (ref 96–112)
Creat: 0.45 mg/dL — ABNORMAL LOW (ref 0.50–1.10)
Glucose, Bld: 91 mg/dL (ref 70–99)
Indirect Bilirubin: 0.7 mg/dL (ref 0.2–1.2)
POTASSIUM: 4.7 meq/L (ref 3.5–5.3)
Sodium: 140 mEq/L (ref 135–145)
TOTAL PROTEIN: 7.2 g/dL (ref 6.0–8.3)
Total Bilirubin: 0.9 mg/dL (ref 0.2–1.2)

## 2015-03-22 LAB — CBC WITH DIFFERENTIAL/PLATELET
BASOS ABS: 0 10*3/uL (ref 0.0–0.1)
BASOS PCT: 0 % (ref 0–1)
EOS ABS: 0 10*3/uL (ref 0.0–0.7)
EOS PCT: 0 % (ref 0–5)
HEMATOCRIT: 40.8 % (ref 36.0–46.0)
Hemoglobin: 13.6 g/dL (ref 12.0–15.0)
LYMPHS ABS: 2 10*3/uL (ref 0.7–4.0)
Lymphocytes Relative: 16 % (ref 12–46)
MCH: 29.8 pg (ref 26.0–34.0)
MCHC: 33.3 g/dL (ref 30.0–36.0)
MCV: 89.5 fL (ref 78.0–100.0)
MPV: 10.1 fL (ref 8.6–12.4)
Monocytes Absolute: 1 10*3/uL (ref 0.1–1.0)
Monocytes Relative: 8 % (ref 3–12)
NEUTROS ABS: 9.7 10*3/uL — AB (ref 1.7–7.7)
Neutrophils Relative %: 76 % (ref 43–77)
Platelets: 312 10*3/uL (ref 150–400)
RBC: 4.56 MIL/uL (ref 3.87–5.11)
RDW: 13.6 % (ref 11.5–15.5)
WBC: 12.8 10*3/uL — AB (ref 4.0–10.5)

## 2015-03-22 NOTE — Progress Notes (Addendum)
Martha Paul  WUJ:[id number]  [id number]:[id number]  DOB - Sep 07, 1975  Chief Complaint  Patient presents with  . Hospitalization Follow-up       Subjective:   Martha Paul is a 39 y.o. female here today for establishment of care. She was in the emergency department on July 18 with diffuse abdominal pain. An ultrasound of her gallbladder was abnormal, it showed gallstones and thickening. Her white blood cell count was mildly elevated as well. General surgery consultation was obtained and she went for laparoscopic cholecystectomy on July 18 by Dr. Johna Sheriff. Her postoperative course was uncomplicated. She was discharged on 03/20/2015 with Vicodin. She was noted to have elevated blood pressure during the hospitalization but this was not treated with oral antihypertensives at discharge. She did receive some prns in the hospital. Since discharge he been doing relatively well. She was a nauseous on yesterday and wondered if she may have had some chills. She denies fevers. She denies drainage from her surgical site. She is still a little sore.  ROS: GEN: denies fever + chills, denies change in weight HEENT: denies headache, earache, epistaxis, sore throat, or neck pain LUNGS: denies SHOB, dyspnea, PND, orthopnea CV: denies CP or palpitations ABD: sore abd, N or V  ALLERGIES: No Known Allergies  PAST MEDICAL HISTORY: Past Medical History  Diagnosis Date  . Migraine   . PIH (pregnancy induced hypertension)     with all three pregnancies    PAST SURGICAL HISTORY: Past Surgical History  Procedure Laterality Date  . Cesarean section  07/29/2004  . Cesarean section N/A 06/06/2014    Procedure: CESAREAN SECTION;  Surgeon: Lesly Dukes, MD;  Location: [facility];  Service: Obstetrics;  Laterality: N/A;  . Cholecystectomy N/A 03/19/2015    Procedure: LAPAROSCOPIC CHOLECYSTECTOMY WITH INTRAOPERATIVE CHOLANGIOGRAM;  Surgeon: Glenna Fellows, MD;  Location: [facility];  Service: General;   Laterality: N/A;    MEDICATIONS AT HOME: Prior to Admission medications   Medication Sig Start Date End Date Taking? Authorizing Provider  HYDROcodone-acetaminophen (NORCO/VICODIN) 5-325 MG per tablet Take 1-2 tablets by mouth every 6 (six) hours as needed for moderate pain or severe pain. 03/20/15  Yes Nonie Hoyer, PA-C  acetaminophen (TYLENOL) 500 MG tablet Take 1 tablet (500 mg total) by mouth every 6 (six) hours as needed. Patient not taking: Reported on 03/22/2015 07/19/14   Catalina Antigua, MD     Objective:   Filed Vitals:   03/22/15 1156  BP: 120/87  Pulse: 103  Temp: 97.5 F (36.4 C)  TempSrc: Oral  Resp: 18  Height: 5' (1.524 m)  Weight: 160 lb (72.576 kg)  SpO2: 97%    Exam General appearance : Awake, alert, not in any distress. Speech Clear. Not toxic looking Chest:Good air entry bilaterally, no added sounds  CVS: S1 S2 regular, no murmurs.  Abdomen: Bowel sounds present, Non tender and not distended with no gaurding, rigidity or rebound.; no drainage from surgical site, some umbilical ecchymosis   Assessment & Plan  1. Cholelithiasis status post laparoscopic cholecystectomy, postop day #3  -Cont prn pain meds  -Keep appt with Dr. Johna Sheriff for 8/9  -CBC, CMP today for f/u 2. Elevated BP without HTN  -keep a log  -DASH diet and exercise  -recheck in 6 weeks   Return in about 6 weeks (around 05/03/2015).  The patient was given clear instructions to go to ER or return to medical center if symptoms don't improve, worsen or new problems develop. The patient verbalized  understanding. The patient was told to call to get lab results if they haven't heard anything in the next week.   This note has been created with Education officer, environmental. Any transcriptional errors are unintentional.    Scot Jun, PA-C [facility] [place], [place] [phone]   03/22/2015, 12:23 PM

## 2015-03-22 NOTE — Progress Notes (Signed)
Patient reports having high blood pressure, patient had gallbladder surgically removed on Monday. Patient reports having hard time seeing bright lights, and feels pins and needles in back of head. Patient feels pressure in right abdomen, she thinks from surgery.

## 2015-03-27 ENCOUNTER — Telehealth: Payer: Self-pay

## 2015-03-27 NOTE — Telephone Encounter (Signed)
-----   Message from Vivianne Master, [place] sent at 03/26/2015  8:58 AM EDT ----- Please let her know that she still has some abnormalities in her blood work (liver function tests). Confirm that she is doing ok and without fevers etc. If so, encourage her to keep her appt on 8/9 with Dr. Jamse Mead office. Thanks.   ----- Message -----    From: Lab in [facility] Interface    Sent: 03/22/2015  11:17 PM      To: Vivianne Master, PA-C

## 2015-03-27 NOTE — Telephone Encounter (Signed)
Nurse called patient, via in house interpreter, Hunter. Patient verified date of birth. Patient reports having chills and reports having "swollen liver" because she had pain on Friday. Used ice pack for pain.  Patient is taking temperature with thermometer at home. Reports no fevers.  Currently, pain level is 2, described as "swollen" or pressure, located under ribs at abdomen, where she had gallbladder pain before. Patient reports vaginal bleeding, starting 03/23/15, period due 03/31/15.  Nurse is calling Dr. Carmie Kanner office to see if they can get patient in sooner.  Nurse spoke with Nettie Elm, RN at Dr. Carmie Kanner office. Nettie Elm advised patient to watch for redness around incisions or hot to touch, increasing tenderness, any drainage or fever 101 or more, call Dr. Jamse Mead office immediately.  Nurse and interpreter, Jomarie Longs, returned call to patient. Patient aware to call Dr. Carmie Kanner office is any of the following occurs: redness around incisions or hot to touch, increasing tenderness, any drainage or fever 101 or more.  Patient voices understanding and has no further questions at this time.

## 2015-05-03 ENCOUNTER — Ambulatory Visit: Payer: Self-pay | Admitting: Family Medicine

## 2015-12-13 ENCOUNTER — Ambulatory Visit: Payer: Self-pay | Admitting: Family Medicine

## 2017-10-24 ENCOUNTER — Encounter (HOSPITAL_COMMUNITY): Payer: Self-pay | Admitting: Emergency Medicine

## 2017-10-24 ENCOUNTER — Emergency Department (HOSPITAL_COMMUNITY): Payer: Self-pay

## 2017-10-24 ENCOUNTER — Emergency Department (HOSPITAL_COMMUNITY)
Admission: EM | Admit: 2017-10-24 | Discharge: 2017-10-24 | Disposition: A | Discharge status: Home / Self Care | Payer: Self-pay | Attending: Emergency Medicine | Admitting: Emergency Medicine

## 2017-10-24 DIAGNOSIS — Y92410 Unspecified street and highway as the place of occurrence of the external cause: Secondary | ICD-10-CM | POA: Insufficient documentation

## 2017-10-24 DIAGNOSIS — M542 Cervicalgia: Secondary | ICD-10-CM | POA: Insufficient documentation

## 2017-10-24 DIAGNOSIS — Y9389 Activity, other specified: Secondary | ICD-10-CM | POA: Insufficient documentation

## 2017-10-24 DIAGNOSIS — Y999 Unspecified external cause status: Secondary | ICD-10-CM | POA: Insufficient documentation

## 2017-10-24 DIAGNOSIS — Z8632 Personal history of gestational diabetes: Secondary | ICD-10-CM | POA: Insufficient documentation

## 2017-10-24 MED ORDER — NAPROXEN 500 MG PO TABS: 500.0000 mg | ORAL_TABLET | Freq: Two times a day (BID) | ORAL | 0 refills | Status: AC

## 2017-10-24 MED ORDER — METHOCARBAMOL 500 MG PO TABS: 500.0000 mg | ORAL_TABLET | Freq: Three times a day (TID) | ORAL | 0 refills | Status: AC | PRN

## 2017-10-24 NOTE — ED Notes (Signed)
Bed: WTR7 Expected date:  Expected time:  Means of arrival:  Comments: 
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
## 2017-10-24 NOTE — ED Notes (Signed)
Patient reports that she hasnt taken her HTN medications in several days
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
## 2022-07-10 ENCOUNTER — Ambulatory Visit
Admission: RE | Admit: 2022-07-10 | Discharge: 2022-07-10 | Disposition: A | Discharge status: Home / Self Care | Payer: Managed Care, Other (non HMO) | Source: Ambulatory Visit | Attending: Internal Medicine | Admitting: Internal Medicine

## 2022-07-10 ENCOUNTER — Other Ambulatory Visit: Payer: Self-pay

## 2022-07-10 VITALS — BP 146/85 | HR 103 | Temp 98.0°F | Resp 18

## 2022-07-10 DIAGNOSIS — J029 Acute pharyngitis, unspecified: Secondary | ICD-10-CM

## 2022-07-10 DIAGNOSIS — R490 Dysphonia: Secondary | ICD-10-CM

## 2022-07-10 DIAGNOSIS — G43009 Migraine without aura, not intractable, without status migrainosus: Secondary | ICD-10-CM | POA: Diagnosis present

## 2022-07-10 LAB — POCT RAPID STREP A (OFFICE): Rapid Strep A Screen: NEGATIVE

## 2022-07-10 MED ORDER — KETOROLAC TROMETHAMINE 30 MG/ML IJ SOLN
30.0000 mg | Freq: Once | INTRAMUSCULAR | Status: AC
  Administered 2022-07-10: 30 mg via INTRAMUSCULAR

## 2022-07-10 MED ORDER — DEXAMETHASONE SODIUM PHOSPHATE 10 MG/ML IJ SOLN
10.0000 mg | Freq: Once | INTRAMUSCULAR | Status: AC
  Administered 2022-07-10: 10 mg via INTRAMUSCULAR

## 2022-07-10 NOTE — Discharge Instructions (Addendum)
You have a migraine headache which is being treated with 2 shots today.  Please avoid taking any ibuprofen, Advil, Aleve, Excedrin, aspirin for at least 24 hours following injections.  Follow-up with headache clinic at provided contact information.  We have made you a primary care doctor appointment which you will follow-up with as well.  Please go to the hospital if no improvement in headache in the next 24 to 48 hours.  Rapid strep test was negative.  Throat culture is pending.  Suspect viral cause to your hoarseness/sore throat.  Recommend humidifier, voice rest, plenty of fluids.

## 2022-07-10 NOTE — ED Triage Notes (Signed)
Pt here for cough and URI sx x 4 days 

## 2022-07-10 NOTE — ED Provider Notes (Signed)
[facility]    CSN: [id number] Arrival date & time: 07/10/22  0951      History   Chief Complaint Chief Complaint  Patient presents with   Cough    Im very sick and I have a lot of stress also , my throat is also bothering me right now - Entered by patient    HPI Martha Paul is a 46 y.o. female.   Patient presents with several different chief complaints today.  Patient reports that she has a migraine headache that radiates down into the back of her neck that has been present for about 4 days.  Patient reports that she has been dealing intermittently with migraines that feel similar to this for about 7 to 8 years.  She states that she has never seen a doctor for it.  They typically resolve with over-the-counter medications but she has been taking over-the-counter medications for this one with no improvement.  She states that she took Excedrin Migraine last night around 9:30 PM with minimal improvement.  She states that she sometimes has light sensitivity and left eyelid drooping when she has her migraines but does not have any of these current symptoms at this time.  Denies nausea, vomiting, blurred vision.  Denies any recent falls, injuries, or head traumas.  Her head pain is present in the forehead and sometimes in the left side of the head.  It also radiates down to the bilateral neck.  Denies that she takes any daily medications and denies any pertinent medical history.  Patient also reports that she has hoarseness that started around the same time as a migraine.  She reports that she had a sore throat prior to hoarseness starting that has now resolved.  She denies nasal congestion, runny nose, scratchy throat, cough, fever.  She also denies any known sick contacts.   Cough   Past Medical History:  Diagnosis Date   Migraine    PIH (pregnancy induced hypertension)    with all three pregnancies    Patient Active Problem List   Diagnosis Date Noted    Cholecystitis, acute with cholelithiasis 03/20/2015   Gallstones 03/19/2015   Pre-eclampsia 06/06/2014   Supervision of high-risk pregnancy 05/29/2014   Previous cesarean delivery, antepartum 05/29/2014   Prior pregnancy complicated by [facility] antepartum 05/29/2014   Migraine headache without aura 05/29/2014   AMA (advanced maternal age) multigravida 35+ 05/29/2014   Hypertension in pregnancy, postpartum condition 05/29/2014    Past Surgical History:  Procedure Laterality Date   CESAREAN SECTION  07/29/2004   CESAREAN SECTION N/A 06/06/2014   Procedure: CESAREAN SECTION;  Surgeon: Guss Bunde, MD;  Location: [facility];  Service: Obstetrics;  Laterality: N/A;   CHOLECYSTECTOMY N/A 03/19/2015   Procedure: LAPAROSCOPIC CHOLECYSTECTOMY WITH INTRAOPERATIVE CHOLANGIOGRAM;  Surgeon: Excell Seltzer, MD;  Location: [facility];  Service: General;  Laterality: N/A;    OB History     Gravida  6   Para  5   Term  4   Preterm  1   AB  1   Living  5      SAB  1   IAB      Ectopic      Multiple      Live Births  5            Home Medications    Prior to Admission medications   Medication Sig Start Date End Date Taking? Authorizing Provider  HYDROcodone-acetaminophen (NORCO/VICODIN) 5-325 MG per tablet  Take 1-2 tablets by mouth every 6 (six) hours as needed for moderate pain or severe pain. Patient not taking: Reported on 07/10/2022 03/20/15   Nonie Hoyer, PA-C  methocarbamol (ROBAXIN) 500 MG tablet Take 1 tablet (500 mg total) by mouth every 8 (eight) hours as needed for muscle spasms. 10/24/17   Petrucelli, Samantha R, PA-C  naproxen (NAPROSYN) 500 MG tablet Take 1 tablet (500 mg total) by mouth 2 (two) times daily. 10/24/17   Petrucelli, Pleas Koch, PA-C    Family History History reviewed. No pertinent family history.  Social History Social History   Tobacco Use   Smoking status: Never   Smokeless tobacco: Never  Vaping Use   Vaping Use: Never used  Substance  Use Topics   Alcohol use: No   Drug use: No     Allergies   Patient has no known allergies.   Review of Systems Review of Systems Per HPI  Physical Exam Triage Vital Signs ED Triage Vitals  Enc Vitals Group     BP 07/10/22 1022 (!) 146/85     Pulse Rate 07/10/22 1022 (!) 103     Resp 07/10/22 1022 18     Temp 07/10/22 1022 98 F (36.7 C)     Temp Source 07/10/22 1022 Oral     SpO2 07/10/22 1022 95 %     Weight --      Height --      Head Circumference --      Peak Flow --      Pain Score 07/10/22 1023 0     Pain Loc --      Pain Edu? --      Excl. in GC? --    No data found.  Updated Vital Signs BP (!) 146/85 (BP Location: Left Arm)   Pulse (!) 103   Temp 98 F (36.7 C) (Oral)   Resp 18   SpO2 95%   Visual Acuity Right Eye Distance:   Left Eye Distance:   Bilateral Distance:    Right Eye Near:   Left Eye Near:    Bilateral Near:     Physical Exam Constitutional:      General: She is not in acute distress.    Appearance: Normal appearance. She is not toxic-appearing or diaphoretic.  HENT:     Head: Normocephalic and atraumatic.     Right Ear: Tympanic membrane and ear canal normal.     Left Ear: Tympanic membrane and ear canal normal.     Nose: Nose normal.     Mouth/Throat:     Mouth: Mucous membranes are moist.     Pharynx: Posterior oropharyngeal erythema present. No pharyngeal swelling, oropharyngeal exudate or uvula swelling.     Tonsils: No tonsillar exudate or tonsillar abscesses.  Eyes:     Extraocular Movements: Extraocular movements intact.     Conjunctiva/sclera: Conjunctivae normal.     Pupils: Pupils are equal, round, and reactive to light.  Neck:     Comments: Tenderness to palpation to bilateral lateral neck that extends slightly into bilateral upper back.  No direct spinal tenderness, crepitus, step-off, discoloration, swelling noted. Cardiovascular:     Rate and Rhythm: Normal rate and regular rhythm.     Pulses: Normal pulses.      Heart sounds: Normal heart sounds.  Pulmonary:     Effort: Pulmonary effort is normal. No respiratory distress.     Breath sounds: Normal breath sounds. No stridor. No wheezing, rhonchi or rales.  Chest:     Chest wall: No tenderness.  Musculoskeletal:     Cervical back: Normal range of motion. Muscular tenderness present. No spinous process tenderness.  Neurological:     General: No focal deficit present.     Mental Status: She is alert and oriented to person, place, and time. Mental status is at baseline.     Cranial Nerves: Cranial nerves 2-12 are intact.     Sensory: Sensation is intact.     Motor: Motor function is intact.     Coordination: Coordination is intact.     Gait: Gait is intact.  Psychiatric:        Mood and Affect: Mood normal.        Behavior: Behavior normal.        Thought Content: Thought content normal.        Judgment: Judgment normal.      UC Treatments / Results  Labs (all labs ordered are listed, but only abnormal results are displayed) Labs Reviewed  CULTURE, GROUP A STREP [facility])  POCT RAPID STREP A (OFFICE)    EKG   Radiology No results found.  Procedures Procedures (including critical care time)  Medications Ordered in UC Medications  ketorolac (TORADOL) 30 MG/ML injection 30 mg (has no administration in time range)  dexamethasone (DECADRON) injection 10 mg (has no administration in time range)    Initial Impression / Assessment and Plan / UC Course  I have reviewed the triage vital signs and the nursing notes.  Pertinent labs & imaging results that were available during my care of the patient were reviewed by me and considered in my medical decision making (see chart for details).     Patient reporting migraine headache that feels similar to migraine headaches that she has been having for approximately 7 to 8 years.  She denies any recent falls or head trauma and physical exam and neuro exam are normal.  Therefore, do not think  that any emergent evaluation or imaging of the head is necessary at this time.  Will treat with IM Decadron and IM Toradol as patient reports that she does not have any concern for pregnancy and is not currently breast-feeding.  No other obvious contraindications to medications noted in patient's history as well.  Patient was advised to go to the emergency department if headache does not improve or if it worsens in the next 24 to 48 hours.  Also recommended the patient see neurologist/headache clinic for further evaluation and management given duration of migraine headaches.  Patient was provided with contact information and advised to follow-up.  Patient voiced understanding.  PCP appt made for patient as well prior to discharge.  Suspect that hoarseness and sore throat are not related to migraine headache and is simply coincidental that they started at the same time.  Posterior pharynx is mildly erythematous so strep test was completed which was negative.  Suspect viral illness versus allergies causing patient discomfort.  Advised patient of supportive care including humidifiers, voice rest, increasing fluids to help alleviate hoarseness.  Patient was advised to follow-up if any of the symptoms persist or worsen today.  Patient verbalized understanding and was agreeable with plan.  Interpreter used throughout patient interaction. Final Clinical Impressions(s) / UC Diagnoses   Final diagnoses:  Migraine without aura and without status migrainosus, not intractable  Hoarseness  Sore throat     Discharge Instructions      You have a migraine headache which is being treated with 2  shots today.  Please avoid taking any ibuprofen, Advil, Aleve, Excedrin, aspirin for at least 24 hours following injections.  Follow-up with headache clinic at provided contact information.  We have made you a primary care doctor appointment which you will follow-up with as well.  Please go to the hospital if no improvement in  headache in the next 24 to 48 hours.  Rapid strep test was negative.  Throat culture is pending.  Suspect viral cause to your hoarseness/sore throat.  Recommend humidifier, voice rest, plenty of fluids.    ED Prescriptions   None    PDMP not reviewed this encounter.   Teodora Medici,  07/10/22 1146

## 2022-07-11 LAB — CULTURE, GROUP A STREP (THRC)

## 2022-07-12 LAB — CULTURE, GROUP A STREP (THRC)

## 2022-07-18 ENCOUNTER — Ambulatory Visit: Payer: Managed Care, Other (non HMO) | Admitting: Family

## 2022-08-14 ENCOUNTER — Encounter: Payer: Self-pay | Admitting: *Deleted
